# Patient Record
Sex: Male | Born: 1950 | Race: Black or African American | Hispanic: No | Marital: Married | Smoking: Former smoker
Health system: Southern US, Community
[De-identification: ages and names within clinical notes are randomized; demographics above are authoritative.]

## PROBLEM LIST (undated history)

## (undated) DIAGNOSIS — N451 Epididymitis: Secondary | ICD-10-CM

## (undated) DIAGNOSIS — E785 Hyperlipidemia, unspecified: Secondary | ICD-10-CM

## (undated) DIAGNOSIS — I739 Peripheral vascular disease, unspecified: Secondary | ICD-10-CM

## (undated) DIAGNOSIS — I2510 Atherosclerotic heart disease of native coronary artery without angina pectoris: Secondary | ICD-10-CM

## (undated) DIAGNOSIS — I10 Essential (primary) hypertension: Secondary | ICD-10-CM

## (undated) DIAGNOSIS — E119 Type 2 diabetes mellitus without complications: Secondary | ICD-10-CM

## (undated) HISTORY — PX: HEMORRHOID SURGERY: SHX153

## (undated) HISTORY — PX: ABOVE KNEE LEG AMPUTATION: SUR20

---

## 2015-08-03 ENCOUNTER — Encounter (HOSPITAL_COMMUNITY): Payer: Self-pay | Admitting: *Deleted

## 2015-08-03 ENCOUNTER — Emergency Department (HOSPITAL_COMMUNITY)
Admission: EM | Admit: 2015-08-03 | Discharge: 2015-08-03 | Disposition: A | Discharge status: Home / Self Care | Payer: Medicare Other | Attending: Emergency Medicine | Admitting: Emergency Medicine

## 2015-08-03 DIAGNOSIS — Z79899 Other long term (current) drug therapy: Secondary | ICD-10-CM | POA: Diagnosis not present

## 2015-08-03 DIAGNOSIS — E1165 Type 2 diabetes mellitus with hyperglycemia: Secondary | ICD-10-CM | POA: Diagnosis not present

## 2015-08-03 DIAGNOSIS — Z7984 Long term (current) use of oral hypoglycemic drugs: Secondary | ICD-10-CM | POA: Diagnosis not present

## 2015-08-03 DIAGNOSIS — Z7982 Long term (current) use of aspirin: Secondary | ICD-10-CM | POA: Diagnosis not present

## 2015-08-03 DIAGNOSIS — I2510 Atherosclerotic heart disease of native coronary artery without angina pectoris: Secondary | ICD-10-CM | POA: Insufficient documentation

## 2015-08-03 DIAGNOSIS — Z87891 Personal history of nicotine dependence: Secondary | ICD-10-CM | POA: Diagnosis not present

## 2015-08-03 DIAGNOSIS — R319 Hematuria, unspecified: Secondary | ICD-10-CM | POA: Diagnosis not present

## 2015-08-03 DIAGNOSIS — I10 Essential (primary) hypertension: Secondary | ICD-10-CM | POA: Diagnosis not present

## 2015-08-03 DIAGNOSIS — Z7901 Long term (current) use of anticoagulants: Secondary | ICD-10-CM | POA: Insufficient documentation

## 2015-08-03 DIAGNOSIS — R739 Hyperglycemia, unspecified: Secondary | ICD-10-CM

## 2015-08-03 HISTORY — DX: Peripheral vascular disease, unspecified: I73.9

## 2015-08-03 HISTORY — DX: Essential (primary) hypertension: I10

## 2015-08-03 HISTORY — DX: Atherosclerotic heart disease of native coronary artery without angina pectoris: I25.10

## 2015-08-03 HISTORY — DX: Type 2 diabetes mellitus without complications: E11.9

## 2015-08-03 LAB — URINALYSIS, ROUTINE W REFLEX MICROSCOPIC
Bilirubin Urine: NEGATIVE
Glucose, UA: 500 mg/dL — AB
Ketones, ur: NEGATIVE mg/dL
Leukocytes, UA: NEGATIVE
Nitrite: NEGATIVE
Specific Gravity, Urine: >1.03 — ABNORMAL HIGH (ref 1.005–1.030)
pH: 5 (ref 5.0–8.0)

## 2015-08-03 LAB — COMPREHENSIVE METABOLIC PANEL
ALK PHOS: 96 U/L (ref 38–126)
ALT: 18 U/L (ref 17–63)
ANION GAP: 11 (ref 5–15)
AST: 22 U/L (ref 15–41)
Albumin: 4.3 g/dL (ref 3.5–5.0)
BILIRUBIN TOTAL: 0.6 mg/dL (ref 0.3–1.2)
BUN: 16 mg/dL (ref 6–20)
CALCIUM: 9.5 mg/dL (ref 8.9–10.3)
CO2: 24 mmol/L (ref 22–32)
CREATININE: 1.38 mg/dL — AB (ref 0.61–1.24)
Chloride: 100 mmol/L — ABNORMAL LOW (ref 101–111)
GFR calc non Af Amer: 52 mL/min — ABNORMAL LOW (ref >60)
Glucose, Bld: 262 mg/dL — ABNORMAL HIGH (ref 65–99)
Potassium: 4 mmol/L (ref 3.5–5.1)
Sodium: 135 mmol/L (ref 135–145)
TOTAL PROTEIN: 7.4 g/dL (ref 6.5–8.1)

## 2015-08-03 LAB — URINE MICROSCOPIC-ADD ON

## 2015-08-03 LAB — CBC WITH DIFFERENTIAL/PLATELET
Basophils Absolute: 0 10*3/uL (ref 0.0–0.1)
Basophils Relative: 0 %
Eosinophils Absolute: 0.2 10*3/uL (ref 0.0–0.7)
Eosinophils Relative: 3 %
HCT: 42.4 % (ref 39.0–52.0)
Hemoglobin: 14.1 g/dL (ref 13.0–17.0)
Lymphocytes Relative: 39 %
Lymphs Abs: 2 10*3/uL (ref 0.7–4.0)
MCH: 27.5 pg (ref 26.0–34.0)
MCHC: 33.3 g/dL (ref 30.0–36.0)
MCV: 82.8 fL (ref 78.0–100.0)
Monocytes Absolute: 0.5 10*3/uL (ref 0.1–1.0)
Monocytes Relative: 9 %
Neutro Abs: 2.6 10*3/uL (ref 1.7–7.7)
Neutrophils Relative %: 49 %
Platelets: 278 10*3/uL (ref 150–400)
RBC: 5.12 MIL/uL (ref 4.22–5.81)
RDW: 13.5 % (ref 11.5–15.5)
WBC: 5.3 10*3/uL (ref 4.0–10.5)

## 2015-08-03 LAB — CBG MONITORING, ED
Glucose-Capillary: 265 mg/dL — ABNORMAL HIGH (ref 65–99)
Glucose-Capillary: 235 mg/dL — ABNORMAL HIGH (ref 65–99)

## 2015-08-03 MED ORDER — SODIUM CHLORIDE 0.9 % IV BOLUS (SEPSIS)
1000.0000 mL | Freq: Once | INTRAVENOUS | Status: AC
  Administered 2015-08-03: 1000 mL via INTRAVENOUS

## 2015-08-03 NOTE — Discharge Instructions (Signed)
Please continue your metformin and your glimepiride as prescribed. Your kidney function was mildly elevated which appears to be chronic and you also had a small amount of blood in your urine. I recommend you follow-up with your primary care physician for this.   Hyperglycemia Hyperglycemia occurs when the glucose (sugar) in your blood is too high. Hyperglycemia can happen for many reasons, but it most often happens to people who do not know they have diabetes or are not managing their diabetes properly.  CAUSES  Whether you have diabetes or not, there are other causes of hyperglycemia. Hyperglycemia can occur when you have diabetes, but it can also occur in other situations that you might not be as aware of, such as: Diabetes  If you have diabetes and are having problems controlling your blood glucose, hyperglycemia could occur because of some of the following reasons:  Not following your meal plan.  Not taking your diabetes medications or not taking it properly.  Exercising less or doing less activity than you normally do.  Being sick. Pre-diabetes  This cannot be ignored. Before people develop Type 2 diabetes, they almost always have "pre-diabetes." This is when your blood glucose levels are higher than normal, but not yet high enough to be diagnosed as diabetes. Research has shown that some long-term damage to the body, especially the heart and circulatory system, may already be occurring during pre-diabetes. If you take action to manage your blood glucose when you have pre-diabetes, you may delay or prevent Type 2 diabetes from developing. Stress  If you have diabetes, you may be "diet" controlled or on oral medications or insulin to control your diabetes. However, you may find that your blood glucose is higher than usual in the hospital whether you have diabetes or not. This is often referred to as "stress hyperglycemia." Stress can elevate your blood glucose. This happens because of  hormones put out by the body during times of stress. If stress has been the cause of your high blood glucose, it can be followed regularly by your caregiver. That way he/she can make sure your hyperglycemia does not continue to get worse or progress to diabetes. Steroids  Steroids are medications that act on the infection fighting system (immune system) to block inflammation or infection. One side effect can be a rise in blood glucose. Most people can produce enough extra insulin to allow for this rise, but for those who cannot, steroids make blood glucose levels go even higher. It is not unusual for steroid treatments to "uncover" diabetes that is developing. It is not always possible to determine if the hyperglycemia will go away after the steroids are stopped. A special blood test called an A1c is sometimes done to determine if your blood glucose was elevated before the steroids were started. SYMPTOMS  Thirsty.  Frequent urination.  Dry mouth.  Blurred vision.  Tired or fatigue.  Weakness.  Sleepy.  Tingling in feet or leg. DIAGNOSIS  Diagnosis is made by monitoring blood glucose in one or all of the following ways:  A1c test. This is a chemical found in your blood.  Fingerstick blood glucose monitoring.  Laboratory results. TREATMENT  First, knowing the cause of the hyperglycemia is important before the hyperglycemia can be treated. Treatment may include, but is not be limited to:  Education.  Change or adjustment in medications.  Change or adjustment in meal plan.  Treatment for an illness, infection, etc.  More frequent blood glucose monitoring.  Change in exercise plan.  Decreasing or stopping steroids.  Lifestyle changes. HOME CARE INSTRUCTIONS   Test your blood glucose as directed.  Exercise regularly. Your caregiver will give you instructions about exercise. Pre-diabetes or diabetes which comes on with stress is helped by exercising.  Eat wholesome,  balanced meals. Eat often and at regular, fixed times. Your caregiver or nutritionist will give you a meal plan to guide your sugar intake.  Being at an ideal weight is important. If needed, losing as little as 10 to 15 pounds may help improve blood glucose levels. SEEK MEDICAL CARE IF:   You have questions about medicine, activity, or diet.  You continue to have symptoms (problems such as increased thirst, urination, or weight gain). SEEK IMMEDIATE MEDICAL CARE IF:   You are vomiting or have diarrhea.  Your breath smells fruity.  You are breathing faster or slower.  You are very sleepy or incoherent.  You have numbness, tingling, or pain in your feet or hands.  You have chest pain.  Your symptoms get worse even though you have been following your caregiver's orders.  If you have any other questions or concerns.   This information is not intended to replace advice given to you by your health care provider. Make sure you discuss any questions you have with your health care provider.   Document Released: 12/10/2000 Document Revised: 09/08/2011 Document Reviewed: 02/20/2015 Elsevier Interactive Patient Education 2016 [employer]  Blood Glucose Monitoring, Adult Monitoring your blood glucose (also know as blood sugar) helps you to manage your diabetes. It also helps you and your health care provider monitor your diabetes and determine how well your treatment plan is working. WHY SHOULD YOU MONITOR YOUR BLOOD GLUCOSE?  It can help you understand how food, exercise, and medicine affect your blood glucose.  It allows you to know what your blood glucose is at any given moment. You can quickly tell if you are having low blood glucose (hypoglycemia) or high blood glucose (hyperglycemia).  It can help you and your health care provider know how to adjust your medicines.  It can help you understand how to manage an illness or adjust medicine for exercise. WHEN SHOULD YOU TEST? Your  health care provider will help you decide how often you should check your blood glucose. This may depend on the type of diabetes you have, your diabetes control, or the types of medicines you are taking. Be sure to write down all of your blood glucose readings so that this information can be reviewed with your health care provider. See below for examples of testing times that your health care provider may suggest. Type 1 Diabetes  Test at least 2 times per day if your diabetes is well controlled, if you are using an insulin pump, or if you perform multiple daily injections.  If your diabetes is not well controlled or if you are sick, you may need to test more often.  It is a good idea to also test:  Before every insulin injection.  Before and after exercise.  Between meals and 2 hours after a meal.  Occasionally between 2:00 a.m. and 3:00 a.m. Type 2 Diabetes  If you are taking insulin, test at least 2 times per day. However, it is best to test before every insulin injection.  If you take medicines by mouth (orally), test 2 times a day.  If you are on a controlled diet, test once a day.  If your diabetes is not well controlled or if you are sick,  you may need to monitor more often. HOW TO MONITOR YOUR BLOOD GLUCOSE Supplies Needed  Blood glucose meter.  Test strips for your meter. Each meter has its own strips. You must use the strips that go with your own meter.  A pricking needle (lancet).  A device that holds the lancet (lancing device).  A journal or log book to write down your results. Procedure  Wash your hands with soap and water. Alcohol is not preferred.  Prick the side of your finger (not the tip) with the lancet.  Gently milk the finger until a small drop of blood appears.  Follow the instructions that come with your meter for inserting the test strip, applying blood to the strip, and using your blood glucose meter. Other Areas to Get Blood for Testing Some  meters allow you to use other areas of your body (other than your finger) to test your blood. These areas are called alternative sites. The most common alternative sites are:  The forearm.  The thigh.  The back area of the lower leg.  The palm of the hand. The blood flow in these areas is slower. Therefore, the blood glucose values you get may be delayed, and the numbers are different from what you would get from your fingers. Do not use alternative sites if you think you are having hypoglycemia. Your reading will not be accurate. Always use a finger if you are having hypoglycemia. Also, if you cannot feel your lows (hypoglycemia unawareness), always use your fingers for your blood glucose checks. ADDITIONAL TIPS FOR GLUCOSE MONITORING  Do not reuse lancets.  Always carry your supplies with you.  All blood glucose meters have a 24-hour "hotline" number to call if you have questions or need help.  Adjust (calibrate) your blood glucose meter with a control solution after finishing a few boxes of strips. BLOOD GLUCOSE RECORD KEEPING It is a good idea to keep a daily record or log of your blood glucose readings. Most glucose meters, if not all, keep your glucose records stored in the meter. Some meters come with the ability to download your records to your home computer. Keeping a record of your blood glucose readings is especially helpful if you are wanting to look for patterns. Make notes to go along with the blood glucose readings because you might forget what happened at that exact time. Keeping good records helps you and your health care provider to work together to achieve good diabetes management.    This information is not intended to replace advice given to you by your health care provider. Make sure you discuss any questions you have with your health care provider.   Document Released: 06/19/2003 Document Revised: 07/07/2014 Document Reviewed: 11/08/2012 Elsevier Interactive Patient  Education [employer]

## 2015-08-03 NOTE — ED Notes (Signed)
Pt c/o his blood sugar readings being "up and down" for a couple of days.

## 2015-08-03 NOTE — ED Provider Notes (Signed)
CHIEF COMPLAINT: Hyperglycemia  HPI: Pt is a 65 y.o. male with history of hypertension, diabetes, peripheral vascular disease status post right AKA who is on Xarelto who presents to the emergency department complaints of hyperglycemia. Reports that his blood glucose has been "up and down" for the past 2 days. States the highest it was at home was 401. Was on metformin 1000 mg twice a day. Saw his primary care physician Dr. Merleen Milliner 2 days ago who started him on glimepiride 2 mg once a day. States he is all he had 1 dose of this medication yesterday morning. He denies feeling poorly. No fevers, cough, chest pain or shortness of breath, vomiting or diarrhea. No polydipsia or polyuria.  ROS: See HPI Constitutional: no fever  Eyes: no drainage  ENT: no runny nose   Cardiovascular:  no chest pain  Resp: no SOB  GI: no vomiting GU: no dysuria Integumentary: no rash  Allergy: no hives  Musculoskeletal: no leg swelling  Neurological: no slurred speech ROS otherwise negative  PAST MEDICAL HISTORY/PAST SURGICAL HISTORY:  Past Medical History  Diagnosis Date  . Diabetes mellitus without complication (HCC)   . Hypertension   . Coronary artery disease   . Peripheral arterial disease (HCC)     MEDICATIONS:  Prior to Admission medications   Medication Sig Start Date End Date Taking? Authorizing Provider  aspirin 81 MG tablet Take 81 mg by mouth daily.   Yes Historical Provider, MD  atorvastatin (LIPITOR) 40 MG tablet Take 40 mg by mouth daily.   Yes Historical Provider, MD  cloNIDine (CATAPRES) 0.3 MG tablet Take 0.3 mg by mouth 2 (two) times daily.   Yes Historical Provider, MD  ferrous sulfate 325 (65 FE) MG tablet Take 325 mg by mouth 2 (two) times daily with a meal.   Yes Historical Provider, MD  glimepiride (AMARYL) 2 MG tablet Take 2 mg by mouth daily with breakfast.   Yes Historical Provider, MD  hydrALAZINE (APRESOLINE) 25 MG tablet Take 25 mg by mouth 3 (three) times daily.   Yes  Historical Provider, MD  labetalol (NORMODYNE) 200 MG tablet Take 400 mg by mouth 2 (two) times daily.   Yes Historical Provider, MD  losartan (COZAAR) 50 MG tablet Take 50 mg by mouth daily.   Yes Historical Provider, MD  magnesium oxide (MAG-OX) 400 MG tablet Take 400 mg by mouth 2 (two) times daily.   Yes Historical Provider, MD  metFORMIN (GLUCOPHAGE) 1000 MG tablet Take 1,000 mg by mouth 2 (two) times daily with a meal.   Yes Historical Provider, MD  montelukast (SINGULAIR) 10 MG tablet Take 10 mg by mouth at bedtime.   Yes Historical Provider, MD  rivaroxaban (XARELTO) 20 MG TABS tablet Take 20 mg by mouth daily with supper.   Yes Historical Provider, MD    ALLERGIES:  Allergies  Allergen Reactions  . Ivp Dye [Iodinated Diagnostic Agents]     SOCIAL HISTORY:  Social History  Substance Use Topics  . Smoking status: Former Games developer  . Smokeless tobacco: Not on file  . Alcohol Use: No    FAMILY HISTORY: History reviewed. No pertinent family history.  EXAM: BP 131/49 mmHg  Pulse 66  Temp(Src) 97.8 F (36.6 C) (Oral)  Resp 20  Ht  (1.676 m)  Wt 205 lb (92.987 kg)  BMI 33.10 kg/m2  SpO2 98% CONSTITUTIONAL: Alert and oriented and responds appropriately to questions. Well-appearing; well-nourished, pleasant, smiling, laughing HEAD: Normocephalic EYES: Conjunctivae clear, PERRL ENT: normal nose;  no rhinorrhea; moist mucous membranes; pharynx without lesions noted NECK: Supple, no meningismus, no LAD  CARD: RRR; S1 and S2 appreciated; no murmurs, no clicks, no rubs, no gallops RESP: Normal chest excursion without splinting or tachypnea; breath sounds clear and equal bilaterally; no wheezes, no rhonchi, no rales, no hypoxia or respiratory distress, speaking full sentences ABD/GI: Normal bowel sounds; non-distended; soft, non-tender, no rebound, no guarding, no peritoneal signs BACK:  The back appears normal and is non-tender to palpation, there is no CVA tenderness EXT:  Status post right AKA, Normal ROM in all joints; non-tender to palpation; no edema; normal capillary refill; no cyanosis, no calf tenderness or swelling    SKIN: Normal color for age and race; warm NEURO: Moves all extremities equally, sensation to light touch intact diffusely, cranial nerves II through XII intact PSYCH: The patient's mood and manner are appropriate. Grooming and personal hygiene are appropriate.  MEDICAL DECISION MAKING: Patient here with complaints of hyperglycemia. Blood glucose is 265 today. Exam is benign and he has no complaints. He is hemodynamically stable. Discussed with patient that it may take time for his, pruritus start working. Had a lengthy discussion with patient and his family about diet. He states that he does eat sweets and drinks sodas. Discussed with him that this is likely causing some of his hyperglycemia. I recommended that he check his blood sugar in the morning before he has eaten and then 2 hours after each meal. Discussed with him that have his blood glucoses over 500 and he cannot get it down that he return to the emergency department. We'll check basic labs and urine today to enter there is no other cause that could be contributing to his hyperglycemia. We'll give IV fluids and recheck blood glucose.  ED PROGRESS: After liter fluid patient's blood glucoses down to 235. He still has no complaints. Labs show creatinine of 1.28 which appears to be chronic as his BUN is normal. No old for comparison. Recommended that he avoid NSAIDs and drink plain water and follow-up with his primary care physician for this. He also has some hematuria but no sign of urinary tract infection. Culture is pending. Discussed this finding with patient as well as advised him to follow-up with his primary care physician. I feel he is safe to be discharged home and continue his metformin 1000 mg twice a day and, prior to milligrams once a day. I have given him and his family at a large  amount of information on diet control and diabetes. They verbalize understanding and are comfortable with this plan. He does have a PCP for follow-up.     Layla Maw Ward, DO 08/03/15 [phone]

## 2015-08-06 LAB — URINE CULTURE: Culture: >=100000

## 2015-08-07 ENCOUNTER — Telehealth (HOSPITAL_COMMUNITY): Payer: Self-pay

## 2015-08-07 NOTE — Progress Notes (Signed)
ED Antimicrobial Stewardship Positive Culture Follow Up   Randeep Biondolillo is an 65 y.o. male who presented to [facility] on 08/03/2015 with a chief complaint of  Chief Complaint  Patient presents with  . Hyperglycemia    Recent Results (from the past 720 hour(s))  Urine culture     Status: None   Collection Time: 08/03/15  5:04 AM  Result Value Ref Range Status   Specimen Description URINE, CLEAN CATCH  Final   Special Requests NONE  Final   Culture   Final    >=100,000 COLONIES/mL AEROCOCCUS URINAE Performed at [facility]    Report Status 08/06/2015 FINAL  Final     Patient discharged originally without antimicrobial agent and treatment is now indicated   New antibiotic prescription: Macrobid  PO BID x 5 days  ED Provider: Allen Derry PA-C   Armandina Stammer 08/07/2015, 9:06 AM Infectious Diseases Pharmacist Phone# [phone]

## 2015-08-07 NOTE — Telephone Encounter (Signed)
Post ED Visit - Positive Culture Follow-up: Chart Hand-off to ED Flow Manager  Culture assessed and recommendations reviewed by:  Isaac Bliss, Pharm.D., BCPS  Celedonio Miyamoto, Pharm.D., BCPS-AQ ID  Georgina Pillion, Pharm.D., BCPS  [place], [address].D., BCPS, AAHIVP  Estella Husk, Pharm .D., BCPS, AAHIVP  Tennis Must, Pharm.D.  Casilda Carls, Pharm.D.  Positive urine culture   Patient discharged without antimicrobial prescription and treatment is now indicated  Organism is resistant to prescribed ED discharge antimicrobial  Patient with positive blood cultures  Changes discussed with ED provider: [place] Ravens Camprobi-soms New antibiotic prescription Macrobid  po bid x 5 days.   No answer. Letter sent to address on file  Ashley Jacobs 08/07/2015, 11:35 AM

## 2015-08-18 ENCOUNTER — Telehealth (HOSPITAL_COMMUNITY): Payer: Self-pay

## 2015-08-18 NOTE — Telephone Encounter (Signed)
Unable to contact pt by mail or telephone. Unable to communicate lab results or treatment changes. 

## 2015-08-23 ENCOUNTER — Encounter (HOSPITAL_COMMUNITY): Payer: Self-pay | Admitting: Emergency Medicine

## 2015-08-23 ENCOUNTER — Emergency Department (HOSPITAL_COMMUNITY): Payer: Medicare Other

## 2015-08-23 ENCOUNTER — Emergency Department (HOSPITAL_COMMUNITY)
Admission: EM | Admit: 2015-08-23 | Discharge: 2015-08-23 | Disposition: A | Discharge status: Home / Self Care | Payer: Medicare Other | Source: Home / Self Care | Attending: Emergency Medicine | Admitting: Emergency Medicine

## 2015-08-23 DIAGNOSIS — E119 Type 2 diabetes mellitus without complications: Secondary | ICD-10-CM | POA: Insufficient documentation

## 2015-08-23 DIAGNOSIS — I739 Peripheral vascular disease, unspecified: Secondary | ICD-10-CM | POA: Insufficient documentation

## 2015-08-23 DIAGNOSIS — N3090 Cystitis, unspecified without hematuria: Secondary | ICD-10-CM

## 2015-08-23 DIAGNOSIS — I10 Essential (primary) hypertension: Secondary | ICD-10-CM | POA: Insufficient documentation

## 2015-08-23 DIAGNOSIS — Z7984 Long term (current) use of oral hypoglycemic drugs: Secondary | ICD-10-CM | POA: Insufficient documentation

## 2015-08-23 DIAGNOSIS — E785 Hyperlipidemia, unspecified: Secondary | ICD-10-CM | POA: Insufficient documentation

## 2015-08-23 DIAGNOSIS — Z87891 Personal history of nicotine dependence: Secondary | ICD-10-CM | POA: Insufficient documentation

## 2015-08-23 DIAGNOSIS — Z79899 Other long term (current) drug therapy: Secondary | ICD-10-CM | POA: Insufficient documentation

## 2015-08-23 DIAGNOSIS — I2510 Atherosclerotic heart disease of native coronary artery without angina pectoris: Secondary | ICD-10-CM | POA: Insufficient documentation

## 2015-08-23 DIAGNOSIS — A419 Sepsis, unspecified organism: Secondary | ICD-10-CM | POA: Diagnosis not present

## 2015-08-23 DIAGNOSIS — R509 Fever, unspecified: Secondary | ICD-10-CM | POA: Diagnosis not present

## 2015-08-23 HISTORY — DX: Hyperlipidemia, unspecified: E78.5

## 2015-08-23 LAB — URINE MICROSCOPIC-ADD ON: SQUAMOUS EPITHELIAL / LPF: NONE SEEN

## 2015-08-23 LAB — CBC WITH DIFFERENTIAL/PLATELET
Basophils Absolute: 0 10*3/uL (ref 0.0–0.1)
Basophils Relative: 0 %
EOS ABS: 0.3 10*3/uL (ref 0.0–0.7)
EOS PCT: 2 %
HCT: 42.2 % (ref 39.0–52.0)
Hemoglobin: 13.6 g/dL (ref 13.0–17.0)
LYMPHS ABS: 1.8 10*3/uL (ref 0.7–4.0)
Lymphocytes Relative: 12 %
MCH: 26.7 pg (ref 26.0–34.0)
MCHC: 32.2 g/dL (ref 30.0–36.0)
MCV: 82.7 fL (ref 78.0–100.0)
MONOS PCT: 4 %
Monocytes Absolute: 0.6 10*3/uL (ref 0.1–1.0)
NEUTROS PCT: 82 %
Neutro Abs: 11.6 10*3/uL — ABNORMAL HIGH (ref 1.7–7.7)
PLATELETS: 366 10*3/uL (ref 150–400)
RBC: 5.1 MIL/uL (ref 4.22–5.81)
RDW: 13.8 % (ref 11.5–15.5)
WBC: 14.2 10*3/uL — ABNORMAL HIGH (ref 4.0–10.5)

## 2015-08-23 LAB — URINALYSIS, ROUTINE W REFLEX MICROSCOPIC
BILIRUBIN URINE: NEGATIVE
BILIRUBIN URINE: NEGATIVE
Glucose, UA: NEGATIVE mg/dL
Glucose, UA: NEGATIVE mg/dL
KETONES UR: NEGATIVE mg/dL
Ketones, ur: NEGATIVE mg/dL
NITRITE: POSITIVE — AB
Nitrite: NEGATIVE
PH: 5 (ref 5.0–8.0)
Protein, ur: 30 mg/dL — AB
Protein, ur: 100 mg/dL — AB
pH: 5 (ref 5.0–8.0)

## 2015-08-23 LAB — COMPREHENSIVE METABOLIC PANEL
ALT: 15 U/L — AB (ref 17–63)
ANION GAP: 11 (ref 5–15)
AST: 17 U/L (ref 15–41)
Albumin: 4 g/dL (ref 3.5–5.0)
Alkaline Phosphatase: 92 U/L (ref 38–126)
BUN: 16 mg/dL (ref 6–20)
CHLORIDE: 104 mmol/L (ref 101–111)
CO2: 24 mmol/L (ref 22–32)
CREATININE: 1.23 mg/dL (ref 0.61–1.24)
Calcium: 9.8 mg/dL (ref 8.9–10.3)
GFR, EST NON AFRICAN AMERICAN: 60 mL/min — AB (ref >60)
Glucose, Bld: 194 mg/dL — ABNORMAL HIGH (ref 65–99)
POTASSIUM: 4.6 mmol/L (ref 3.5–5.1)
Sodium: 139 mmol/L (ref 135–145)
Total Bilirubin: 0.3 mg/dL (ref 0.3–1.2)
Total Protein: 7.9 g/dL (ref 6.5–8.1)

## 2015-08-23 MED ORDER — TRAMADOL HCL 50 MG PO TABS: 50.0000 mg | ORAL_TABLET | Freq: Four times a day (QID) | ORAL | Status: DC | PRN

## 2015-08-23 MED ORDER — CEPHALEXIN 500 MG PO CAPS: 500.0000 mg | ORAL_CAPSULE | Freq: Four times a day (QID) | ORAL | Status: DC

## 2015-08-23 MED ORDER — CEPHALEXIN 500 MG PO CAPS
500.0000 mg | ORAL_CAPSULE | Freq: Once | ORAL | Status: AC
Start: 2015-08-23 — End: 2015-08-23
  Administered 2015-08-23: 500 mg via ORAL
  Filled 2015-08-23: qty 1

## 2015-08-23 MED ORDER — KETOROLAC TROMETHAMINE 30 MG/ML IJ SOLN
30.0000 mg | Freq: Once | INTRAMUSCULAR | Status: AC
  Administered 2015-08-23: 30 mg via INTRAVENOUS
  Filled 2015-08-23: qty 1

## 2015-08-23 NOTE — ED Provider Notes (Signed)
CSN: [id number]     Arrival date & time 08/23/15  4098 History   First MD Initiated Contact with Patient 08/23/15 [phone]     Chief Complaint  Patient presents with  . Flank Pain     (Consider location/radiation/quality/duration/timing/severity/associated sxs/prior Treatment) HPI Comments: The patient is a 65 year old male, he has a history of diabetes and hypertension as well as peripheral arterial disease, he is status post right above-the-knee amputation which she states was done in March of last year secondary to poor perfusion. He presents with less than 12 hours of symptoms including blood in his urine last night, he reports no burning with urination to me. He also reports that he has had left lower quadrant pain and possibly some left flank pain. It gets worse when he moves, better when he holds still, sometimes the pain is there any way. No fevers chills nausea or diarrhea. The symptoms are persistent throughout the evening  Patient is a 65 y.o. male presenting with flank pain. The history is provided by the patient.  Flank Pain    Past Medical History  Diagnosis Date  . Diabetes mellitus without complication (HCC)   . Hypertension   . Coronary artery disease   . Peripheral arterial disease (HCC)   . Hyperlipidemia    Past Surgical History  Procedure Laterality Date  . Above knee leg amputation      right leg  . Hemorrhoid surgery     History reviewed. No pertinent family history. Social History  Substance Use Topics  . Smoking status: Former Games developer  . Smokeless tobacco: None  . Alcohol Use: No    Review of Systems  Genitourinary: Positive for flank pain.  All other systems reviewed and are negative.     Allergies  Ivp dye  Home Medications   Prior to Admission medications   Medication Sig Start Date End Date Taking? Authorizing Provider  aspirin 81 MG tablet Take 81 mg by mouth daily.   Yes Historical Provider, MD  atorvastatin (LIPITOR) 40 MG tablet Take  40 mg by mouth daily.   Yes Historical Provider, MD  cloNIDine (CATAPRES) 0.3 MG tablet Take 0.3 mg by mouth 2 (two) times daily.   Yes Historical Provider, MD  ergocalciferol (VITAMIN D2) 50000 units capsule Take 50,000 Units by mouth once a week.   Yes Historical Provider, MD  ferrous sulfate 325 (65 FE) MG tablet Take 325 mg by mouth 2 (two) times daily with a meal.   Yes Historical Provider, MD  glimepiride (AMARYL) 2 MG tablet Take 2 mg by mouth daily with breakfast.   Yes Historical Provider, MD  hydrALAZINE (APRESOLINE) 25 MG tablet Take 25 mg by mouth 3 (three) times daily.   Yes Historical Provider, MD  labetalol (NORMODYNE) 200 MG tablet Take 400 mg by mouth 2 (two) times daily.   Yes Historical Provider, MD  losartan (COZAAR) 50 MG tablet Take 50 mg by mouth daily.   Yes Historical Provider, MD  magnesium oxide (MAG-OX) 400 MG tablet Take 400 mg by mouth 2 (two) times daily.   Yes Historical Provider, MD  metFORMIN (GLUCOPHAGE) 1000 MG tablet Take 1,000 mg by mouth 2 (two) times daily with a meal.   Yes Historical Provider, MD  montelukast (SINGULAIR) 10 MG tablet Take 10 mg by mouth at bedtime.   Yes Historical Provider, MD  rivaroxaban (XARELTO) 20 MG TABS tablet Take 20 mg by mouth daily with supper.   Yes Historical Provider, MD  cephALEXin (KEFLEX) 500  MG capsule Take 1 capsule (500 mg total) by mouth 4 (four) times daily. 08/23/15   Eber Hong, MD  traMADol (ULTRAM) 50 MG tablet Take 1 tablet (50 mg total) by mouth every 6 (six) hours as needed. 08/23/15   Eber Hong, MD   BP 151/60 mmHg  Pulse 83  Temp(Src) 98.9 F (37.2 C) (Oral)  Resp 18  Ht 5\' 6"  (1.676 m)  Wt 200 lb (90.719 kg)  BMI 32.30 kg/m2  SpO2 98% Physical Exam  Constitutional: He appears well-developed and well-nourished. No distress.  HENT:  Head: Normocephalic and atraumatic.  Mouth/Throat: Oropharynx is clear and moist. No oropharyngeal exudate.  Eyes: Conjunctivae and EOM are normal. Pupils are equal,  round, and reactive to light. Right eye exhibits no discharge. Left eye exhibits no discharge. No scleral icterus.  Neck: Normal range of motion. Neck supple. No JVD present. No thyromegaly present.  Cardiovascular: Normal rate, regular rhythm, normal heart sounds and intact distal pulses.  Exam reveals no gallop and no friction rub.   No murmur heard. Pulmonary/Chest: Effort normal and breath sounds normal. No respiratory distress. He has no wheezes. He has no rales.  Abdominal: Soft. Bowel sounds are normal. He exhibits no distension and no mass. There is tenderness ( Suprapubic and left lower quadrant tenderness without guarding or masses).  Musculoskeletal: Normal range of motion. He exhibits no edema or tenderness.  Lymphadenopathy:    He has no cervical adenopathy.  Neurological: He is alert. Coordination normal.  Skin: Skin is warm and dry. No rash noted. No erythema.  Psychiatric: He has a normal mood and affect. His behavior is normal.  Nursing note and vitals reviewed.   ED Course  Procedures (including critical care time) Labs Review Labs Reviewed  URINALYSIS, ROUTINE W REFLEX MICROSCOPIC (NOT AT [facility]) - Abnormal; Notable for the following:    Color, Urine BROWN (*)    APPearance HAZY (*)    Specific Gravity, Urine >1.030 (*)    Hgb urine dipstick LARGE (*)    Protein, ur 30 (*)    Nitrite POSITIVE (*)    Leukocytes, UA MODERATE (*)    All other components within normal limits  CBC WITH DIFFERENTIAL/PLATELET - Abnormal; Notable for the following:    WBC 14.2 (*)    Neutro Abs 11.6 (*)    All other components within normal limits  COMPREHENSIVE METABOLIC PANEL - Abnormal; Notable for the following:    Glucose, Bld 194 (*)    ALT 15 (*)    GFR calc non Af Amer 60 (*)    All other components within normal limits  URINE MICROSCOPIC-ADD ON - Abnormal; Notable for the following:    Squamous Epithelial / LPF 0-5 (*)    Bacteria, UA MANY (*)    All other components within  normal limits  URINALYSIS, ROUTINE W REFLEX MICROSCOPIC (NOT AT [facility]) - Abnormal; Notable for the following:    Color, Urine BROWN (*)    APPearance TURBID (*)    Specific Gravity, Urine >1.030 (*)    Hgb urine dipstick LARGE (*)    Protein, ur 100 (*)    Leukocytes, UA MODERATE (*)    All other components within normal limits  URINE MICROSCOPIC-ADD ON - Abnormal; Notable for the following:    Bacteria, UA MANY (*)    All other components within normal limits    Imaging Review Ct Abdomen Pelvis Wo Contrast  08/23/2015  CLINICAL DATA:  Left lower quadrant abdominal pain. Hematuria last  night. EXAM: CT ABDOMEN AND PELVIS WITHOUT CONTRAST TECHNIQUE: Multidetector CT imaging of the abdomen and pelvis was performed following the standard protocol without IV contrast. COMPARISON:  None. FINDINGS: Lower chest and abdominal wall: Borderline cardiomegaly. RCA atherosclerotic calcification or stent. Hepatobiliary: No focal liver abnormality.No evidence of biliary obstruction or stone. Pancreas: Unremarkable. Spleen: Unremarkable. Adrenals/Urinary Tract: Negative adrenals. No hydronephrosis or stone. Lobulated cortex of the lower pole right kidney, likely scarring. Unremarkable bladder given degree of distention. Reproductive:Mild symmetric enlargement of the prostate. Stomach/Bowel:  No obstruction. No appendicitis. Vascular/Lymphatic: Bilateral lower extremity bypass, with 3 grafts into the right thigh. No mass or adenopathy. Peritoneal: No ascites or pneumoperitoneum. Musculoskeletal: No acute abnormalities. Facet arthropathy in the lumbar spine. IMPRESSION: No explanation for acute abdominal pain. Electronically Signed   By: Marnee Spring M.D.   On: 08/23/2015 08:51   I have personally reviewed and evaluated these images and lab results as part of my medical decision-making.   MDM   Final diagnoses:  Cystitis    The patient has a well-healed stump of his right above-the-knee amputation, he has  minimal scant edema on the left lower extremity, he has tenderness in the abdomen which is concerning for potential urinary infection, kidney stone or diverticulitis, due to the patient's history of diabetes and peripheral arterial disease I will obtain labs including a urinalysis, blood counts, renal function and a CT scan to further evaluate the source of this pain. The patient is in agreement with the plan.  Labs show increased WBC UA c/w infection - culture ordered VS as below -s table Pt improved and informed of resutls Expressed understanding to d/c instructions  Meds given in ED:  Medications  cephALEXin (KEFLEX) capsule 500 mg (not administered)  ketorolac (TORADOL) 30 MG/ML injection 30 mg (30 mg Intravenous Given 08/23/15 0804)    New Prescriptions   CEPHALEXIN (KEFLEX) 500 MG CAPSULE    Take 1 capsule (500 mg total) by mouth 4 (four) times daily.   TRAMADOL (ULTRAM) 50 MG TABLET    Take 1 tablet (50 mg total) by mouth every 6 (six) hours as needed.         Eber Hong, MD 08/23/15 [phone]

## 2015-08-23 NOTE — ED Notes (Signed)
Pt reports blood in urine, burning with urination, foul odor since last night.  Pt also has left flank pain, denies fever/chills.

## 2015-08-23 NOTE — Discharge Instructions (Signed)

## 2015-08-24 ENCOUNTER — Encounter (HOSPITAL_COMMUNITY): Payer: Self-pay | Admitting: Emergency Medicine

## 2015-08-24 ENCOUNTER — Emergency Department (HOSPITAL_COMMUNITY): Payer: Medicare Other

## 2015-08-24 ENCOUNTER — Inpatient Hospital Stay (HOSPITAL_COMMUNITY)
Admission: EM | Admit: 2015-08-24 | Discharge: 2015-08-26 | DRG: 872 | Disposition: A | Discharge status: Home / Self Care | Payer: Medicare Other | Attending: Internal Medicine | Admitting: Internal Medicine

## 2015-08-24 DIAGNOSIS — I10 Essential (primary) hypertension: Secondary | ICD-10-CM | POA: Diagnosis not present

## 2015-08-24 DIAGNOSIS — Z23 Encounter for immunization: Secondary | ICD-10-CM

## 2015-08-24 DIAGNOSIS — I2510 Atherosclerotic heart disease of native coronary artery without angina pectoris: Secondary | ICD-10-CM | POA: Diagnosis present

## 2015-08-24 DIAGNOSIS — N390 Urinary tract infection, site not specified: Secondary | ICD-10-CM

## 2015-08-24 DIAGNOSIS — A419 Sepsis, unspecified organism: Secondary | ICD-10-CM | POA: Diagnosis present

## 2015-08-24 DIAGNOSIS — R319 Hematuria, unspecified: Secondary | ICD-10-CM

## 2015-08-24 DIAGNOSIS — Z7982 Long term (current) use of aspirin: Secondary | ICD-10-CM | POA: Diagnosis not present

## 2015-08-24 DIAGNOSIS — Z7984 Long term (current) use of oral hypoglycemic drugs: Secondary | ICD-10-CM

## 2015-08-24 DIAGNOSIS — E1122 Type 2 diabetes mellitus with diabetic chronic kidney disease: Secondary | ICD-10-CM | POA: Diagnosis present

## 2015-08-24 DIAGNOSIS — I129 Hypertensive chronic kidney disease with stage 1 through stage 4 chronic kidney disease, or unspecified chronic kidney disease: Secondary | ICD-10-CM | POA: Diagnosis present

## 2015-08-24 DIAGNOSIS — N183 Chronic kidney disease, stage 3 (moderate): Secondary | ICD-10-CM

## 2015-08-24 DIAGNOSIS — R509 Fever, unspecified: Secondary | ICD-10-CM | POA: Diagnosis present

## 2015-08-24 DIAGNOSIS — N50819 Testicular pain, unspecified: Secondary | ICD-10-CM

## 2015-08-24 DIAGNOSIS — E785 Hyperlipidemia, unspecified: Secondary | ICD-10-CM | POA: Diagnosis present

## 2015-08-24 DIAGNOSIS — N453 Epididymo-orchitis: Secondary | ICD-10-CM | POA: Diagnosis present

## 2015-08-24 DIAGNOSIS — E1100 Type 2 diabetes mellitus with hyperosmolarity without nonketotic hyperglycemic-hyperosmolar coma (NKHHC): Secondary | ICD-10-CM

## 2015-08-24 DIAGNOSIS — A4102 Sepsis due to Methicillin resistant Staphylococcus aureus: Secondary | ICD-10-CM | POA: Diagnosis not present

## 2015-08-24 DIAGNOSIS — N50812 Left testicular pain: Secondary | ICD-10-CM | POA: Diagnosis present

## 2015-08-24 DIAGNOSIS — Z7901 Long term (current) use of anticoagulants: Secondary | ICD-10-CM

## 2015-08-24 DIAGNOSIS — I739 Peripheral vascular disease, unspecified: Secondary | ICD-10-CM | POA: Diagnosis present

## 2015-08-24 DIAGNOSIS — Z87891 Personal history of nicotine dependence: Secondary | ICD-10-CM

## 2015-08-24 DIAGNOSIS — Z79899 Other long term (current) drug therapy: Secondary | ICD-10-CM

## 2015-08-24 DIAGNOSIS — A4901 Methicillin susceptible Staphylococcus aureus infection, unspecified site: Secondary | ICD-10-CM | POA: Diagnosis present

## 2015-08-24 DIAGNOSIS — E119 Type 2 diabetes mellitus without complications: Secondary | ICD-10-CM

## 2015-08-24 DIAGNOSIS — A4101 Sepsis due to Methicillin susceptible Staphylococcus aureus: Secondary | ICD-10-CM | POA: Diagnosis not present

## 2015-08-24 LAB — URINALYSIS, ROUTINE W REFLEX MICROSCOPIC
BILIRUBIN URINE: NEGATIVE
Glucose, UA: NEGATIVE mg/dL
Ketones, ur: NEGATIVE mg/dL
Nitrite: NEGATIVE
PH: 5 (ref 5.0–8.0)
Protein, ur: 30 mg/dL — AB

## 2015-08-24 LAB — CBC WITH DIFFERENTIAL/PLATELET
BASOS PCT: 0 %
Basophils Absolute: 0 10*3/uL (ref 0.0–0.1)
EOS ABS: 0.1 10*3/uL (ref 0.0–0.7)
EOS PCT: 0 %
HCT: 40.1 % (ref 39.0–52.0)
Hemoglobin: 13.1 g/dL (ref 13.0–17.0)
LYMPHS ABS: 2.5 10*3/uL (ref 0.7–4.0)
Lymphocytes Relative: 10 %
MCH: 27.2 pg (ref 26.0–34.0)
MCHC: 32.7 g/dL (ref 30.0–36.0)
MCV: 83.2 fL (ref 78.0–100.0)
MONO ABS: 1.4 10*3/uL — AB (ref 0.1–1.0)
MONOS PCT: 5 %
Neutro Abs: 22.3 10*3/uL — ABNORMAL HIGH (ref 1.7–7.7)
Neutrophils Relative %: 85 %
Platelets: 335 10*3/uL (ref 150–400)
RBC: 4.82 MIL/uL (ref 4.22–5.81)
RDW: 14.2 % (ref 11.5–15.5)
WBC: 26.3 10*3/uL — ABNORMAL HIGH (ref 4.0–10.5)

## 2015-08-24 LAB — BASIC METABOLIC PANEL
Anion gap: 13 (ref 5–15)
BUN: 14 mg/dL (ref 6–20)
CALCIUM: 9.2 mg/dL (ref 8.9–10.3)
CHLORIDE: 102 mmol/L (ref 101–111)
CO2: 22 mmol/L (ref 22–32)
CREATININE: 1.37 mg/dL — AB (ref 0.61–1.24)
GFR calc non Af Amer: 53 mL/min — ABNORMAL LOW (ref >60)
GLUCOSE: 169 mg/dL — AB (ref 65–99)
Potassium: 4 mmol/L (ref 3.5–5.1)
Sodium: 137 mmol/L (ref 135–145)

## 2015-08-24 LAB — URINE MICROSCOPIC-ADD ON

## 2015-08-24 LAB — LACTIC ACID, PLASMA: LACTIC ACID, VENOUS: 1.2 mmol/L (ref 0.5–2.0)

## 2015-08-24 MED ORDER — VANCOMYCIN HCL IN DEXTROSE 1-5 GM/200ML-% IV SOLN: 1000.0000 mg | Freq: Once | INTRAVENOUS | Status: DC

## 2015-08-24 MED ORDER — INSULIN ASPART 100 UNIT/ML ~~LOC~~ SOLN
0.0000 [IU] | Freq: Three times a day (TID) | SUBCUTANEOUS | Status: DC
Start: 2015-08-25 — End: 2015-08-26
  Administered 2015-08-25 (×3): 3 [IU] via SUBCUTANEOUS
  Administered 2015-08-26: 2 [IU] via SUBCUTANEOUS
  Administered 2015-08-26: 5 [IU] via SUBCUTANEOUS

## 2015-08-24 MED ORDER — ATORVASTATIN CALCIUM 40 MG PO TABS
40.0000 mg | ORAL_TABLET | Freq: Every evening | ORAL | Status: DC
  Administered 2015-08-25: 40 mg via ORAL
  Filled 2015-08-24: qty 1

## 2015-08-24 MED ORDER — HYDRALAZINE HCL 25 MG PO TABS
25.0000 mg | ORAL_TABLET | Freq: Three times a day (TID) | ORAL | Status: DC
  Administered 2015-08-25 – 2015-08-26 (×4): 25 mg via ORAL
  Filled 2015-08-24 (×4): qty 1

## 2015-08-24 MED ORDER — VANCOMYCIN HCL 10 G IV SOLR
1250.0000 mg | Freq: Once | INTRAVENOUS | Status: AC
  Administered 2015-08-24: 1250 mg via INTRAVENOUS
  Filled 2015-08-24: qty 1250

## 2015-08-24 MED ORDER — POLYETHYLENE GLYCOL 3350 17 G PO PACK: 17.0000 g | PACK | Freq: Every day | ORAL | Status: DC | PRN

## 2015-08-24 MED ORDER — BISACODYL 5 MG PO TBEC
5.0000 mg | DELAYED_RELEASE_TABLET | Freq: Every day | ORAL | Status: DC | PRN
  Administered 2015-08-26: 5 mg via ORAL
  Filled 2015-08-24: qty 1

## 2015-08-24 MED ORDER — MONTELUKAST SODIUM 10 MG PO TABS
10.0000 mg | ORAL_TABLET | Freq: Every day | ORAL | Status: DC
  Administered 2015-08-25: 10 mg via ORAL
  Filled 2015-08-24: qty 1

## 2015-08-24 MED ORDER — SODIUM CHLORIDE 0.9 % IV SOLN
INTRAVENOUS | Status: DC
  Administered 2015-08-24: 100 mL/h via INTRAVENOUS

## 2015-08-24 MED ORDER — ACETAMINOPHEN 325 MG PO TABS: 650.0000 mg | ORAL_TABLET | Freq: Four times a day (QID) | ORAL | Status: DC | PRN

## 2015-08-24 MED ORDER — ONDANSETRON HCL 4 MG PO TABS: 4.0000 mg | ORAL_TABLET | Freq: Four times a day (QID) | ORAL | Status: DC | PRN

## 2015-08-24 MED ORDER — DEXTROSE 5 % IV SOLN: 2.0000 g | Freq: Once | INTRAVENOUS | Status: DC

## 2015-08-24 MED ORDER — ASPIRIN EC 81 MG PO TBEC
81.0000 mg | DELAYED_RELEASE_TABLET | Freq: Every morning | ORAL | Status: DC
  Administered 2015-08-25 – 2015-08-26 (×2): 81 mg via ORAL
  Filled 2015-08-24 (×2): qty 1

## 2015-08-24 MED ORDER — HYDROMORPHONE HCL 1 MG/ML IJ SOLN
0.5000 mg | INTRAMUSCULAR | Status: DC | PRN
  Administered 2015-08-25: 0.5 mg via INTRAVENOUS
  Filled 2015-08-24: qty 1

## 2015-08-24 MED ORDER — ACETAMINOPHEN 650 MG RE SUPP: 650.0000 mg | Freq: Four times a day (QID) | RECTAL | Status: DC | PRN

## 2015-08-24 MED ORDER — FERROUS SULFATE 325 (65 FE) MG PO TABS
325.0000 mg | ORAL_TABLET | Freq: Two times a day (BID) | ORAL | Status: DC
  Administered 2015-08-25 – 2015-08-26 (×3): 325 mg via ORAL
  Filled 2015-08-24 (×3): qty 1

## 2015-08-24 MED ORDER — LABETALOL HCL 200 MG PO TABS
400.0000 mg | ORAL_TABLET | Freq: Two times a day (BID) | ORAL | Status: DC
  Administered 2015-08-25 – 2015-08-26 (×3): 400 mg via ORAL
  Filled 2015-08-24 (×3): qty 2

## 2015-08-24 MED ORDER — ONDANSETRON HCL 4 MG/2ML IJ SOLN: 4.0000 mg | Freq: Four times a day (QID) | INTRAMUSCULAR | Status: DC | PRN

## 2015-08-24 MED ORDER — LOSARTAN POTASSIUM 50 MG PO TABS
50.0000 mg | ORAL_TABLET | Freq: Every morning | ORAL | Status: DC
  Administered 2015-08-25 – 2015-08-26 (×2): 50 mg via ORAL
  Filled 2015-08-24 (×2): qty 1

## 2015-08-24 MED ORDER — CIPROFLOXACIN IN D5W 400 MG/200ML IV SOLN
400.0000 mg | Freq: Two times a day (BID) | INTRAVENOUS | Status: DC
  Administered 2015-08-24: 400 mg via INTRAVENOUS
  Filled 2015-08-24: qty 200

## 2015-08-24 MED ORDER — HYDROCODONE-ACETAMINOPHEN 5-325 MG PO TABS
1.0000 | ORAL_TABLET | ORAL | Status: DC | PRN
  Administered 2015-08-25 (×2): 2 via ORAL
  Administered 2015-08-25: 1 via ORAL
  Administered 2015-08-25 – 2015-08-26 (×3): 2 via ORAL
  Filled 2015-08-24 (×6): qty 2

## 2015-08-24 MED ORDER — SODIUM CHLORIDE 0.9 % IV SOLN
INTRAVENOUS | Status: AC
  Administered 2015-08-25: 06:00:00 via INTRAVENOUS

## 2015-08-24 MED ORDER — RIVAROXABAN 20 MG PO TABS
20.0000 mg | ORAL_TABLET | Freq: Every day | ORAL | Status: DC
  Administered 2015-08-25: 20 mg via ORAL
  Filled 2015-08-24 (×4): qty 1

## 2015-08-24 MED ORDER — ACETAMINOPHEN 325 MG PO TABS
650.0000 mg | ORAL_TABLET | Freq: Once | ORAL | Status: AC
Start: 2015-08-24 — End: 2015-08-24
  Administered 2015-08-24: 650 mg via ORAL
  Filled 2015-08-24: qty 2

## 2015-08-24 MED ORDER — MAGNESIUM OXIDE 400 (241.3 MG) MG PO TABS
400.0000 mg | ORAL_TABLET | Freq: Two times a day (BID) | ORAL | Status: DC
  Administered 2015-08-25 – 2015-08-26 (×3): 400 mg via ORAL
  Filled 2015-08-24 (×3): qty 1

## 2015-08-24 MED ORDER — CLONIDINE HCL 0.2 MG PO TABS
0.3000 mg | ORAL_TABLET | Freq: Two times a day (BID) | ORAL | Status: DC
  Administered 2015-08-25 – 2015-08-26 (×3): 0.3 mg via ORAL
  Filled 2015-08-24 (×5): qty 1

## 2015-08-24 MED ORDER — MORPHINE SULFATE (PF) 2 MG/ML IV SOLN
2.0000 mg | INTRAVENOUS | Status: AC | PRN
  Administered 2015-08-24 (×2): 2 mg via INTRAVENOUS
  Filled 2015-08-24 (×2): qty 1

## 2015-08-24 MED ORDER — VITAMIN D (ERGOCALCIFEROL) 1.25 MG (50000 UNIT) PO CAPS: 50000.0000 [IU] | ORAL_CAPSULE | ORAL | Status: DC

## 2015-08-24 MED ORDER — CEFEPIME HCL 2 G IJ SOLR: 2.0000 g | Freq: Once | INTRAMUSCULAR | Status: DC

## 2015-08-24 NOTE — ED Notes (Signed)
Having pain to left groin, rates pain 10/10.  Seen in ED yesterday by Dr Hyacinth Meeker and sent home on tordal, with no relief.

## 2015-08-24 NOTE — ED Notes (Signed)
Calling xray for [place]

## 2015-08-24 NOTE — ED Provider Notes (Signed)
CSN: [id number]     Arrival date & time 08/24/15  1653 History   First MD Initiated Contact with Patient 08/24/15 2032     Chief Complaint  Patient presents with  . Groin Pain    left  . Failure To Thrive  . Fever  . Hematuria      HPI  Pt was seen at 2040. Per pt and his family, c/o gradual onset and worsening of persistent left groin "pain" for the past 2 to 3 days. Has been associated with hematuria. Pt was evaluated in the ED yesterday for his symptoms, dx UTI, rx keflex. Pt's family states today pt continues to c/o groin pain and hematuria, but has had home fevers/chills, poor PO intake, left testicular pain, and generalized weakness/fatigue. Denies CP/palpitations, no SOB/cough, no N/V/D, no back pain, no rash.    Past Medical History  Diagnosis Date  . Diabetes mellitus without complication (HCC)   . Hypertension   . Coronary artery disease   . Peripheral arterial disease (HCC)   . Hyperlipidemia    Past Surgical History  Procedure Laterality Date  . Above knee leg amputation      right leg  . Hemorrhoid surgery      Social History  Substance Use Topics  . Smoking status: Former Games developer  . Smokeless tobacco: None  . Alcohol Use: No    Review of Systems ROS: Statement: All systems negative except as marked or noted in the HPI; Constitutional: +fever and chills, generalized weakness. ; ; Eyes: Negative for eye pain, redness and discharge. ; ; ENMT: Negative for ear pain, hoarseness, nasal congestion, sinus pressure and sore throat. ; ; Cardiovascular: Negative for chest pain, palpitations, diaphoresis, dyspnea and peripheral edema. ; ; Respiratory: Negative for cough, wheezing and stridor. ; ; Gastrointestinal: +poor PO intake. Negative for nausea, vomiting, diarrhea, blood in stool, hematemesis, jaundice and rectal bleeding. . ; ; Genitourinary: Negative for dysuria, flank pain and +hematuria, groin pain. ; ; Genital:  No penile drainage or rash, +left testicular pain and  swelling, no scrotal rash. ;; Musculoskeletal: Negative for back pain and neck pain. Negative for swelling and trauma.; ; Skin: Negative for pruritus, rash, abrasions, blisters, bruising and skin lesion.; ; Neuro: Negative for headache, lightheadedness and neck stiffness. Negative for altered level of consciousness , altered mental status, extremity weakness, paresthesias, involuntary movement, seizure and syncope.     Allergies  Ivp dye  Home Medications   Prior to Admission medications   Medication Sig Start Date End Date Taking? Authorizing Provider  aspirin 81 MG tablet Take 81 mg by mouth daily.    Historical Provider, MD  atorvastatin (LIPITOR) 40 MG tablet Take 40 mg by mouth daily.    Historical Provider, MD  cephALEXin (KEFLEX) 500 MG capsule Take 1 capsule (500 mg total) by mouth 4 (four) times daily. 08/23/15   Eber Hong, MD  cloNIDine (CATAPRES) 0.3 MG tablet Take 0.3 mg by mouth 2 (two) times daily.    Historical Provider, MD  ergocalciferol (VITAMIN D2) 50000 units capsule Take 50,000 Units by mouth once a week.    Historical Provider, MD  ferrous sulfate 325 (65 FE) MG tablet Take 325 mg by mouth 2 (two) times daily with a meal.    Historical Provider, MD  glimepiride (AMARYL) 2 MG tablet Take 2 mg by mouth daily with breakfast.    Historical Provider, MD  hydrALAZINE (APRESOLINE) 25 MG tablet Take 25 mg by mouth 3 (three) times daily.  Historical Provider, MD  labetalol (NORMODYNE) 200 MG tablet Take 400 mg by mouth 2 (two) times daily.    Historical Provider, MD  losartan (COZAAR) 50 MG tablet Take 50 mg by mouth daily.    Historical Provider, MD  magnesium oxide (MAG-OX) 400 MG tablet Take 400 mg by mouth 2 (two) times daily.    Historical Provider, MD  metFORMIN (GLUCOPHAGE) 1000 MG tablet Take 1,000 mg by mouth 2 (two) times daily with a meal.    Historical Provider, MD  montelukast (SINGULAIR) 10 MG tablet Take 10 mg by mouth at bedtime.    Historical Provider, MD   rivaroxaban (XARELTO) 20 MG TABS tablet Take 20 mg by mouth daily with supper.    Historical Provider, MD  traMADol (ULTRAM) 50 MG tablet Take 1 tablet (50 mg total) by mouth every 6 (six) hours as needed. 08/23/15   Eber Hong, MD   BP 160/86 mmHg  Pulse 98  Temp(Src) 101.9 F (38.8 C) (Rectal)  Resp 18  Ht 5\' 6"  (1.676 m)  Wt 220 lb (99.791 kg)  BMI 35.53 kg/m2  SpO2 96% Physical Exam  2045: Physical examination:  Nursing notes reviewed; Vital signs and O2 SAT reviewed;  Constitutional: Well developed, Well nourished, Uncomfortable appearing.; Head:  Normocephalic, atraumatic; Eyes: EOMI, PERRL, No scleral icterus; ENMT: Mouth and pharynx normal, Mucous membranes dry; Neck: Supple, Full range of motion, No lymphadenopathy; Cardiovascular: Regular rate and rhythm, No gallop; Respiratory: Breath sounds clear & equal bilaterally, No wheezes.  Speaking full sentences with ease, Normal respiratory effort/excursion; Chest: Nontender, Movement normal; Abdomen: Soft, + mild suprapubic and LLQ tenderness to palp. No rebound or guarding. Nondistended, Normal bowel sounds; Genitourinary: No CVA tenderness. Genital and rectal exam performed with pt permission and male ED Tech chaperone present during exam.  No perineal erythema or soft tissue crepitus.  No penile lesions or drainage.  No scrotal erythema or lesions.  Normal testicular lie.  +left testicular edema and proximal posterior-lateral testicular tenderness to palp. No inguinal LAN or palpable masses.;; Extremities: Pulses normal, No tenderness, No edema, +right AKA.; Neuro: AA&Ox3, Major CN grossly intact. Speech clear. No gross focal motor or sensory deficits in extremities.; Skin: Color normal, Warm, Dry.   ED Course  Procedures (including critical care time) Labs Review  Imaging Review  I have personally reviewed and evaluated these images and lab results as part of my medical decision-making.   EKG Interpretation None      MDM   MDM Reviewed: previous chart, nursing note and vitals Reviewed previous: labs and CT scan Interpretation: labs and ultrasound     Ct Abdomen Pelvis Wo Contrast 08/23/2015  CLINICAL DATA:  Left lower quadrant abdominal pain. Hematuria last night. EXAM: CT ABDOMEN AND PELVIS WITHOUT CONTRAST TECHNIQUE: Multidetector CT imaging of the abdomen and pelvis was performed following the standard protocol without IV contrast. COMPARISON:  None. FINDINGS: Lower chest and abdominal wall: Borderline cardiomegaly. RCA atherosclerotic calcification or stent. Hepatobiliary: No focal liver abnormality.No evidence of biliary obstruction or stone. Pancreas: Unremarkable. Spleen: Unremarkable. Adrenals/Urinary Tract: Negative adrenals. No hydronephrosis or stone. Lobulated cortex of the lower pole right kidney, likely scarring. Unremarkable bladder given degree of distention. Reproductive:Mild symmetric enlargement of the prostate. Stomach/Bowel:  No obstruction. No appendicitis. Vascular/Lymphatic: Bilateral lower extremity bypass, with 3 grafts into the right thigh. No mass or adenopathy. Peritoneal: No ascites or pneumoperitoneum. Musculoskeletal: No acute abnormalities. Facet arthropathy in the lumbar spine. IMPRESSION: No explanation for acute abdominal pain. Electronically Signed  By: Marnee Spring M.D.   On: 08/23/2015 08:51    Results for orders placed or performed during the hospital encounter of 08/24/15  Culture, blood (routine x 2)  Result Value Ref Range   Specimen Description BLOOD LEFT ARM    Special Requests BOTTLES DRAWN AEROBIC AND ANAEROBIC [facility] EACH    Culture PENDING    Report Status PENDING   Culture, blood (routine x 2)  Result Value Ref Range   Specimen Description BLOOD LEFT HAND    Special Requests BOTTLES DRAWN AEROBIC AND ANAEROBIC 6CC EACH    Culture PENDING    Report Status PENDING   Basic metabolic panel  Result Value Ref Range   Sodium 137 135 - 145 mmol/L   Potassium 4.0  3.5 - 5.1 mmol/L   Chloride 102 101 - 111 mmol/L   CO2 22 22 - 32 mmol/L   Glucose, Bld 169 (H) 65 - 99 mg/dL   BUN 14 6 - 20 mg/dL   Creatinine, Ser 1.61 (H) 0.61 - 1.24 mg/dL   Calcium 9.2 8.9 - 09.6 mg/dL   GFR calc non Af Amer 53 (L) >60 mL/min   GFR calc Af Amer >60 >60 mL/min   Anion gap 13 5 - 15  CBC with Differential  Result Value Ref Range   WBC 26.3 (H) 4.0 - 10.5 K/uL   RBC 4.82 4.22 - 5.81 MIL/uL   Hemoglobin 13.1 13.0 - 17.0 g/dL   HCT 04.5 40.9 - 81.1 %   MCV 83.2 78.0 - 100.0 fL   MCH 27.2 26.0 - 34.0 pg   MCHC 32.7 30.0 - 36.0 g/dL   RDW 91.4 78.2 - 95.6 %   Platelets 335 150 - 400 K/uL   Neutrophils Relative % 85 %   Neutro Abs 22.3 (H) 1.7 - 7.7 K/uL   Lymphocytes Relative 10 %   Lymphs Abs 2.5 0.7 - 4.0 K/uL   Monocytes Relative 5 %   Monocytes Absolute 1.4 (H) 0.1 - 1.0 K/uL   Eosinophils Relative 0 %   Eosinophils Absolute 0.1 0.0 - 0.7 K/uL   Basophils Relative 0 %   Basophils Absolute 0.0 0.0 - 0.1 K/uL  Lactic acid, plasma  Result Value Ref Range   Lactic Acid, Venous 1.2 0.5 - 2.0 mmol/L  Urinalysis, Routine w reflex microscopic  Result Value Ref Range   Color, Urine YELLOW YELLOW   APPearance HAZY (A) CLEAR   Specific Gravity, Urine >1.030 (H) 1.005 - 1.030   pH 5.0 5.0 - 8.0   Glucose, UA NEGATIVE NEGATIVE mg/dL   Hgb urine dipstick LARGE (A) NEGATIVE   Bilirubin Urine NEGATIVE NEGATIVE   Ketones, ur NEGATIVE NEGATIVE mg/dL   Protein, ur 30 (A) NEGATIVE mg/dL   Nitrite NEGATIVE NEGATIVE   Leukocytes, UA SMALL (A) NEGATIVE  Urine microscopic-add on  Result Value Ref Range   Squamous Epithelial / LPF 0-5 (A) NONE SEEN   WBC, UA TOO NUMEROUS TO COUNT 0 - 5 WBC/hpf   RBC / HPF TOO NUMEROUS TO COUNT 0 - 5 RBC/hpf   Bacteria, UA MANY (A) NONE SEEN   US Scrotum 08/24/2015  CLINICAL DATA:  65 year old with 1 day history of left scrotal pain. Patient currently being treated for urinary tract infection. EXAM: SCROTAL ULTRASOUND DOPPLER  ULTRASOUND OF THE TESTICLES TECHNIQUE: Complete ultrasound examination of the testicles, epididymis, and other scrotal structures was performed. Color and spectral Doppler ultrasound were also utilized to evaluate blood flow to the testicles. COMPARISON:  None. FINDINGS: Right testicle Measurements: Approximately 4.2 x 2.6 x 2.4 cm. Normal parenchymal echotexture without mass or microlithiasis. Normal color Doppler flow without evidence of hyperemia. Left testicle Measurements: Approximately 3.5 x 3.1 x 2.9 cm. Normal parenchymal echotexture without mass or microlithiasis. Hyperemia on color Doppler evaluation. Right epididymis: Normal in size and appearance without evidence of hyperemia. Left epididymis:  Enlarged with hyperemia. Hydrocele:  None visualized. Varicocele:  None visualized. Other:  Marked thickening of the skin of the scrotum. Pulsed Doppler interrogation of both testes demonstrates normal low resistance arterial and venous waveforms bilaterally. IMPRESSION: Left epididymo-orchitis. Electronically Signed   By: Hulan Saas M.D.   On: 08/24/2015 21:57   [place] Art/ven Flow Abd Pelv Doppler 08/24/2015  CLINICAL DATA:  65 year old with 1 day history of left scrotal pain. Patient currently being treated for urinary tract infection. EXAM: SCROTAL ULTRASOUND DOPPLER ULTRASOUND OF THE TESTICLES TECHNIQUE: Complete ultrasound examination of the testicles, epididymis, and other scrotal structures was performed. Color and spectral Doppler ultrasound were also utilized to evaluate blood flow to the testicles. COMPARISON:  None. FINDINGS: Right testicle Measurements: Approximately 4.2 x 2.6 x 2.4 cm. Normal parenchymal echotexture without mass or microlithiasis. Normal color Doppler flow without evidence of hyperemia. Left testicle Measurements: Approximately 3.5 x 3.1 x 2.9 cm. Normal parenchymal echotexture without mass or microlithiasis. Hyperemia on color Doppler evaluation. Right epididymis: Normal in size  and appearance without evidence of hyperemia. Left epididymis:  Enlarged with hyperemia. Hydrocele:  None visualized. Varicocele:  None visualized. Other:  Marked thickening of the skin of the scrotum. Pulsed Doppler interrogation of both testes demonstrates normal low resistance arterial and venous waveforms bilaterally. IMPRESSION: Left epididymo-orchitis. Electronically Signed   By: Hulan Saas M.D.   On: 08/24/2015 21:57    2250:  Will dose IV cipro while UC and BC pending. APAP given for fever.  Dx and testing d/w pt and family.  Questions answered.  Verb understanding, agreeable to admit.  T/C to [facility] Dr. Antionette Char, case discussed, including:  HPI, pertinent PM/SHx, VS/PE, dx testing, ED course and treatment:  Agreeable to admit, requests to write temporary orders, obtain medical bed to team APAdmits.   Samuel Jester, DO 08/26/15 2304

## 2015-08-24 NOTE — Progress Notes (Signed)
ANTIBIOTIC CONSULT NOTE-Preliminary  Pharmacy Consult for Vancomycin and Cefepime Indication: UTI/sepsis  Allergies  Allergen Reactions  . Ivp Dye [Iodinated Diagnostic Agents] Swelling    Patient Measurements: Height:  (167.6 cm) Weight: 220 lb (99.791 kg) IBW/kg (Calculated) : 63.8  Vital Signs: Temp: 98.8 F (37.1 C) (02/24 2300) Temp Source: Oral (02/24 2300) BP: 168/76 mmHg (02/24 2300) Pulse Rate: 104 (02/24 2300)  Labs:  Recent Labs  08/23/15 0813 08/24/15 2114  WBC 14.2* 26.3*  HGB 13.6 13.1  PLT 366 335  CREATININE 1.23 1.37*    Estimated Creatinine Clearance: 59.5 mL/min (by C-G formula based on Cr of 1.37).  No results for input(s): VANCOTROUGH, VANCOPEAK, VANCORANDOM, GENTTROUGH, GENTPEAK, GENTRANDOM, TOBRATROUGH, TOBRAPEAK, TOBRARND, AMIKACINPEAK, AMIKACINTROU, AMIKACIN in the last 72 hours.   Microbiology: Recent Results (from the past 720 hour(s))  Urine culture     Status: None   Collection Time: 08/03/15  5:04 AM  Result Value Ref Range Status   Specimen Description URINE, CLEAN CATCH  Final   Special Requests NONE  Final   Culture   Final    >=100,000 COLONIES/mL AEROCOCCUS URINAE Performed at [facility]    Report Status 08/06/2015 FINAL  Final  Urine culture     Status: None (Preliminary result)   Collection Time: 08/23/15  7:55 AM  Result Value Ref Range Status   Specimen Description URINE, CLEAN CATCH  Final   Special Requests NONE  Final   Culture   Final    >=100,000 COLONIES/mL GRAM POSITIVE COCCI Performed at [facility]    Report Status PENDING  Incomplete  Culture, blood (routine x 2)     Status: None (Preliminary result)   Collection Time: 08/24/15  9:25 PM  Result Value Ref Range Status   Specimen Description BLOOD LEFT ARM  Final   Special Requests BOTTLES DRAWN AEROBIC AND ANAEROBIC [facility] EACH  Final   Culture PENDING  Incomplete   Report Status PENDING  Incomplete  Culture, blood (routine x 2)      Status: None (Preliminary result)   Collection Time: 08/24/15  9:30 PM  Result Value Ref Range Status   Specimen Description BLOOD LEFT HAND  Final   Special Requests BOTTLES DRAWN AEROBIC AND ANAEROBIC [facility] EACH  Final   Culture PENDING  Incomplete   Report Status PENDING  Incomplete    Medical History: Past Medical History  Diagnosis Date  . Diabetes mellitus without complication (HCC)   . Hypertension   . Coronary artery disease   . Peripheral arterial disease (HCC)   . Hyperlipidemia     Medications:  Ciprofloxacin 400 mg IV x 1 dose in the ED  Assessment: 65 yo male seen in the ED for 3 day history of dysuria, hematuria and flank pain. Pt s/p cystoscopy @ 1 month ago. Pt was seen 08/23/15 and diagnosed with UTI to be treated with PO cephalexin. Pt now returns with chills and fever and increased scrotal and testicular pain. Pt has WBCs elevated to 26000, is febrile and tachycardic. Empiric antibiotics to be started for sepsis secondary to epididymo-orchitis following instrumentation.  Goal of Therapy:  Vancomycin troughs 15-20 mcg/ml Eradicate infection  Plan:  Preliminary review of pertinent patient information completed.  Protocol will be initiated with one-time doses of Vancomycin 1250 mg IV and Cefepime 2 Gm IV.Marland Kitchen  Jeani Hawking clinical pharmacist will complete review during morning rounds to assess patient and finalize treatment regimen.  Arelia Sneddon, [facility] 08/24/2015,11:42 PM

## 2015-08-24 NOTE — H&P (Signed)
[facility] History and Physical  Panfilo Ketchum RUE:[id number] DOB: 1951/04/12 DOA: 08/24/2015  Referring physician: ED physician PCP: Arlina Robes, MD  Specialists:  Dr. Delford Field (urology)   Chief Complaint:  Fever, hematuria, left groin and testicular pain  HPI: Jeffrey Daugherty is a 65 y.o. male with PMH of type 2 diabetes mellitus, hypertension, hyperlipidemia, and peripheral arterial disease who presents to the ED with 3 days of dysuria, hematuria, and left-sided groin pain. Patient reports being in his usual state of health until approximately 3 days ago when he developed pain in left side of his groin, dysuria, and gross hematuria. By the following day, the symptoms were accompanied by subjective fevers, chills, and sweats. Also by that time, groin pain was beginning to localized to the left side of scrotum and left testicle. Patient denies any new sexual partners but notes that approximately one month ago, he underwent cystoscopy with Dr. Delford Field of urology in [place]. He states that this was performed to evaluate microscopic hematuria and was told that the study was normal. He does not recall of prophylactic antibiotic was used at time of that procedure. Patient was evaluated in the ED yesterday for these complaints, diagnosed with urinary tract infection, and discharged home with Keflex. Despite using the Keflex as prescribed, symptoms continued to worsen, prompting his return to the ED this evening.  In ED, patient was found to be febrile to 38.8 C, tachycardic in the low 100s, and with vitals otherwise stable. Ultrasound of the scrotum and testes was obtained and suggestive of left sided epididymoorchitis. Urine is obtained for analysis and consistent with infection, featuring many bacteria, small leukocyte, and too numerous to count white blood cells. Urine was sent for culture at time of ED visit yesterday and is growing >100,000 CFU gram-positive cocci. CBC is notable for a leukocytosis  to 26,000. Lactic acid is reassuring at 1.2. Normal saline infusion was started in the emergency department, patient's pain was treated with 2 mg IV morphine, urine and blood cultures were obtained, and empiric antibiotics in the form of ciprofloxacin were administered. Patient remained hemodynamically stable in the emergency department and will be admitted for ongoing evaluation and management of sepsis secondary to epididymoorchitis following urologic instrumentation.  Where does patient live?   At home     Can patient participate in ADLs?  Yes        Review of Systems:   General: no weight change. Fever, chills, loss of appetite, fatigue HEENT: no blurry vision, hearing changes or sore throat Pulm: no dyspnea, cough, or wheeze CV: no chest pain or palpitations Abd: no nausea, vomiting, abdominal pain, diarrhea, or constipation GU: no increased urinary frequency, or urgency. Dysuria, hematuria Ext:  Right leg s/p AKA, left leg with edema Neuro: no focal weakness, numbness, or tingling, no vision change or hearing loss Skin: no rash, superficial ulcer on left leg MSK: No muscle spasm, no deformity, no red, hot, or swollen joint Heme: No easy bruising or bleeding Travel history: No recent long distant travel    Allergy:  Allergies  Allergen Reactions  . Ivp Dye [Iodinated Diagnostic Agents] Swelling    Past Medical History  Diagnosis Date  . Diabetes mellitus without complication (HCC)   . Hypertension   . Coronary artery disease   . Peripheral arterial disease (HCC)   . Hyperlipidemia     Past Surgical History  Procedure Laterality Date  . Above knee leg amputation      right leg  . Hemorrhoid surgery  Social History:  reports that he has quit smoking. He does not have any smokeless tobacco history on file. He reports that he does not drink alcohol or use illicit drugs.  Family History: History reviewed. No pertinent family history.   Prior to Admission medications    Medication Sig Start Date End Date Taking? Authorizing Provider  aspirin 81 MG tablet Take 81 mg by mouth every morning.    Yes Historical Provider, MD  atorvastatin (LIPITOR) 40 MG tablet Take 40 mg by mouth every evening.    Yes Historical Provider, MD  cephALEXin (KEFLEX) 500 MG capsule Take 1 capsule (500 mg total) by mouth 4 (four) times daily. 08/23/15  Yes Eber Hong, MD  cloNIDine (CATAPRES) 0.3 MG tablet Take 0.3 mg by mouth 2 (two) times daily.   Yes Historical Provider, MD  ergocalciferol (VITAMIN D2) 50000 units capsule Take 50,000 Units by mouth once a week.   Yes Historical Provider, MD  ferrous sulfate 325 (65 FE) MG tablet Take 325 mg by mouth 2 (two) times daily with a meal.   Yes Historical Provider, MD  glimepiride (AMARYL) 2 MG tablet Take 2 mg by mouth daily with breakfast.   Yes Historical Provider, MD  hydrALAZINE (APRESOLINE) 25 MG tablet Take 25 mg by mouth 3 (three) times daily.   Yes Historical Provider, MD  labetalol (NORMODYNE) 200 MG tablet Take 400 mg by mouth 2 (two) times daily.   Yes Historical Provider, MD  losartan (COZAAR) 50 MG tablet Take 50 mg by mouth every morning.    Yes Historical Provider, MD  magnesium oxide (MAG-OX) 400 MG tablet Take 400 mg by mouth 2 (two) times daily.   Yes Historical Provider, MD  metFORMIN (GLUCOPHAGE) 1000 MG tablet Take 1,000 mg by mouth 2 (two) times daily with a meal.   Yes Historical Provider, MD  montelukast (SINGULAIR) 10 MG tablet Take 10 mg by mouth at bedtime.   Yes Historical Provider, MD  rivaroxaban (XARELTO) 20 MG TABS tablet Take 20 mg by mouth daily with supper.   Yes Historical Provider, MD  traMADol (ULTRAM) 50 MG tablet Take 1 tablet (50 mg total) by mouth every 6 (six) hours as needed. 08/23/15  Yes Eber Hong, MD    Physical Exam: Filed Vitals:   08/24/15 2100 08/24/15 2200 08/24/15 2230 08/24/15 2300  BP: 162/77 124/68 153/70 168/76  Pulse: 98 103 102 104  Temp:    98.8 F (37.1 C)  TempSrc:     Oral  Resp:    20  Height:      Weight:      SpO2: 95% 94% 96% 95%   General: Not in acute respiratory distress. Diaphoretic  HEENT:       Eyes: PERRL, EOMI, no scleral icterus or conjunctival pallor.       ENT: No discharge from the ears or nose, no pharyngeal ulcers, petechiae or exudate, no tonsillar enlargement.        Neck: No JVD, no bruit, no appreciable mass Heme: No cervical adenopathy, no pallor Cardiac: S1/S2, RRR, No murmurs, No gallops or rubs. Pulm: Good air movement bilaterally. No rales, wheezing, rhonchi or rubs. Abd: Soft, nondistended, nontender, no rebound pain or gaurding, BS present. GU:  Induration of left lateral scrotum with exquisite tenderness Ext:  Right leg s/p AKA; left leg edematous to knee; superficial ulceration of anterior left leg Musculoskeletal: No gross deformity, no red, hot, swollen joints   Skin: No rashes on exposed surfaces. Superficial ulcer anterior  left leg Neuro: Alert, oriented X3, cranial nerves II-XII grossly intac. No focal findings Psych: Patient is not overtly psychotic, appropriate mood and affect.  Labs on Admission:  Basic Metabolic Panel:  Recent Labs Lab 08/23/15 0813 08/24/15 2114  NA 139 137  K 4.6 4.0  CL 104 102  CO2 24 22  GLUCOSE 194* 169*  BUN 16 14  CREATININE 1.23 1.37*  CALCIUM 9.8 9.2   Liver Function Tests:  Recent Labs Lab 08/23/15 0813  AST 17  ALT 15*  ALKPHOS 92  BILITOT 0.3  PROT 7.9  ALBUMIN 4.0   No results for input(s): LIPASE, AMYLASE in the last 168 hours. No results for input(s): AMMONIA in the last 168 hours. CBC:  Recent Labs Lab 08/23/15 0813 08/24/15 2114  WBC 14.2* 26.3*  NEUTROABS 11.6* 22.3*  HGB 13.6 13.1  HCT 42.2 40.1  MCV 82.7 83.2  PLT 366 335   Cardiac Enzymes: No results for input(s): CKTOTAL, CKMB, CKMBINDEX, TROPONINI in the last 168 hours.  BNP (last 3 results) No results for input(s): BNP in the last 8760 hours.  ProBNP (last 3 results) No results  for input(s): PROBNP in the last 8760 hours.  CBG: No results for input(s): GLUCAP in the last 168 hours.  Radiological Exams on Admission: Ct Abdomen Pelvis Wo Contrast  08/23/2015  CLINICAL DATA:  Left lower quadrant abdominal pain. Hematuria last night. EXAM: CT ABDOMEN AND PELVIS WITHOUT CONTRAST TECHNIQUE: Multidetector CT imaging of the abdomen and pelvis was performed following the standard protocol without IV contrast. COMPARISON:  None. FINDINGS: Lower chest and abdominal wall: Borderline cardiomegaly. RCA atherosclerotic calcification or stent. Hepatobiliary: No focal liver abnormality.No evidence of biliary obstruction or stone. Pancreas: Unremarkable. Spleen: Unremarkable. Adrenals/Urinary Tract: Negative adrenals. No hydronephrosis or stone. Lobulated cortex of the lower pole right kidney, likely scarring. Unremarkable bladder given degree of distention. Reproductive:Mild symmetric enlargement of the prostate. Stomach/Bowel:  No obstruction. No appendicitis. Vascular/Lymphatic: Bilateral lower extremity bypass, with 3 grafts into the right thigh. No mass or adenopathy. Peritoneal: No ascites or pneumoperitoneum. Musculoskeletal: No acute abnormalities. Facet arthropathy in the lumbar spine. IMPRESSION: No explanation for acute abdominal pain. Electronically Signed   By: Marnee Spring M.D.   On: 08/23/2015 08:51   US Scrotum  08/24/2015  CLINICAL DATA:  65 year old with 1 day history of left scrotal pain. Patient currently being treated for urinary tract infection. EXAM: SCROTAL ULTRASOUND DOPPLER ULTRASOUND OF THE TESTICLES TECHNIQUE: Complete ultrasound examination of the testicles, epididymis, and other scrotal structures was performed. Color and spectral Doppler ultrasound were also utilized to evaluate blood flow to the testicles. COMPARISON:  None. FINDINGS: Right testicle Measurements: Approximately 4.2 x 2.6 x 2.4 cm. Normal parenchymal echotexture without mass or microlithiasis.  Normal color Doppler flow without evidence of hyperemia. Left testicle Measurements: Approximately 3.5 x 3.1 x 2.9 cm. Normal parenchymal echotexture without mass or microlithiasis. Hyperemia on color Doppler evaluation. Right epididymis: Normal in size and appearance without evidence of hyperemia. Left epididymis:  Enlarged with hyperemia. Hydrocele:  None visualized. Varicocele:  None visualized. Other:  Marked thickening of the skin of the scrotum. Pulsed Doppler interrogation of both testes demonstrates normal low resistance arterial and venous waveforms bilaterally. IMPRESSION: Left epididymo-orchitis. Electronically Signed   By: Hulan Saas M.D.   On: 08/24/2015 21:57   [place] Art/ven Flow Abd Pelv Doppler  08/24/2015  CLINICAL DATA:  65 year old with 1 day history of left scrotal pain. Patient currently being treated for urinary tract infection. EXAM:  SCROTAL ULTRASOUND DOPPLER ULTRASOUND OF THE TESTICLES TECHNIQUE: Complete ultrasound examination of the testicles, epididymis, and other scrotal structures was performed. Color and spectral Doppler ultrasound were also utilized to evaluate blood flow to the testicles. COMPARISON:  None. FINDINGS: Right testicle Measurements: Approximately 4.2 x 2.6 x 2.4 cm. Normal parenchymal echotexture without mass or microlithiasis. Normal color Doppler flow without evidence of hyperemia. Left testicle Measurements: Approximately 3.5 x 3.1 x 2.9 cm. Normal parenchymal echotexture without mass or microlithiasis. Hyperemia on color Doppler evaluation. Right epididymis: Normal in size and appearance without evidence of hyperemia. Left epididymis:  Enlarged with hyperemia. Hydrocele:  None visualized. Varicocele:  None visualized. Other:  Marked thickening of the skin of the scrotum. Pulsed Doppler interrogation of both testes demonstrates normal low resistance arterial and venous waveforms bilaterally. IMPRESSION: Left epididymo-orchitis. Electronically Signed   By: Hulan Saas M.D.   On: 08/24/2015 21:57    EKG:  Not done in ED, will obtain as appropriate   Assessment/Plan  1. Sepsis secondary to epididymo-orchitis  - Meets sepsis criteria on arrival with fever, tachycardia, leukocytosis  - US scrotum and testicles c/w left-sided epididymo-orchitis  - Likely secondary to cystoscopy performed within the last month by Dr. Delford Field of [facility]  - Urine culture obtained at time of ED visit 08/23/15 is growing >100k cfu GPC  - Empiric Cipro given in ED after blood cultures obtained  - Will cover healthcare-assoc UTI with empiric cefepime; given growth of GPC on urine culture, will administer empiric vancomycin while awaiting speciation  - Scrotal support, ice pack, Dilaudid prn severe pain  - Follow-up cultures, trend lactate, check procalcitonin  2. Hypertension  - At goal currently  - Will cautiously continue home-dose clonidine, labetalol, losartan   3. Type II DM  - Managed with glipizide and metformin at home; no A1c on file  - Hold home meds while inpt  - Institute a moderate-intensity SSI correctional, adjust prn  - CBG with meals and qHS  - Carb-modified diet when appropriate  - Check A1c, pending   4. CKD stage III  - SCr 1.37 on admission, consistent with prior measurements in our EMR  - Gently hydrating with NS in setting of sepsis  - Avoid nephrotoxic agents where possible  - Repeat chem panel tomorrow    DVT ppx: Continue Xarelto  Code Status: Full code Family Communication:  Yes, patient's wife and sister at bed side Disposition Plan: Admit to inpatient   Date of Service 08/24/2015    Briscoe Deutscher, MD [facility] Pager [phone]  If 7PM-7AM, please contact night-coverage [email or web address] Password [email or web address] 08/24/2015, 11:22 PM

## 2015-08-25 ENCOUNTER — Encounter (HOSPITAL_COMMUNITY): Payer: Self-pay

## 2015-08-25 DIAGNOSIS — N453 Epididymo-orchitis: Secondary | ICD-10-CM

## 2015-08-25 DIAGNOSIS — A4102 Sepsis due to Methicillin resistant Staphylococcus aureus: Secondary | ICD-10-CM

## 2015-08-25 LAB — GLUCOSE, CAPILLARY
GLUCOSE-CAPILLARY: 155 mg/dL — AB (ref 65–99)
Glucose-Capillary: 173 mg/dL — ABNORMAL HIGH (ref 65–99)
Glucose-Capillary: 167 mg/dL — ABNORMAL HIGH (ref 65–99)

## 2015-08-25 LAB — BASIC METABOLIC PANEL
Anion gap: 11 (ref 5–15)
BUN: 14 mg/dL (ref 6–20)
CO2: 24 mmol/L (ref 22–32)
CREATININE: 1.29 mg/dL — AB (ref 0.61–1.24)
Calcium: 9 mg/dL (ref 8.9–10.3)
Chloride: 105 mmol/L (ref 101–111)
GFR calc Af Amer: >60 mL/min (ref >60)
GFR, EST NON AFRICAN AMERICAN: 57 mL/min — AB (ref >60)
GLUCOSE: 183 mg/dL — AB (ref 65–99)
POTASSIUM: 3.9 mmol/L (ref 3.5–5.1)
SODIUM: 140 mmol/L (ref 135–145)

## 2015-08-25 LAB — PROCALCITONIN: PROCALCITONIN: 0.16 ng/mL

## 2015-08-25 LAB — LACTIC ACID, PLASMA: Lactic Acid, Venous: 1.3 mmol/L (ref 0.5–2.0)

## 2015-08-25 LAB — CBC WITH DIFFERENTIAL/PLATELET
BASOS ABS: 0 10*3/uL (ref 0.0–0.1)
Basophils Relative: 0 %
EOS ABS: 0.1 10*3/uL (ref 0.0–0.7)
EOS PCT: 1 %
HCT: 38.7 % — ABNORMAL LOW (ref 39.0–52.0)
Hemoglobin: 12.3 g/dL — ABNORMAL LOW (ref 13.0–17.0)
LYMPHS ABS: 1.9 10*3/uL (ref 0.7–4.0)
LYMPHS PCT: 9 %
MCH: 26.6 pg (ref 26.0–34.0)
MCHC: 31.8 g/dL (ref 30.0–36.0)
MCV: 83.6 fL (ref 78.0–100.0)
MONO ABS: 1.2 10*3/uL — AB (ref 0.1–1.0)
Monocytes Relative: 5 %
Neutro Abs: 18.2 10*3/uL — ABNORMAL HIGH (ref 1.7–7.7)
Neutrophils Relative %: 85 %
PLATELETS: 330 10*3/uL (ref 150–400)
RBC: 4.63 MIL/uL (ref 4.22–5.81)
RDW: 14.3 % (ref 11.5–15.5)
WBC: 21.4 10*3/uL — ABNORMAL HIGH (ref 4.0–10.5)

## 2015-08-25 LAB — URINE CULTURE

## 2015-08-25 MED ORDER — CEFEPIME HCL 1 G IJ SOLR
1.0000 g | Freq: Three times a day (TID) | INTRAMUSCULAR | Status: DC
  Administered 2015-08-25 – 2015-08-26 (×4): 1 g via INTRAVENOUS
  Filled 2015-08-25 (×7): qty 1

## 2015-08-25 MED ORDER — VANCOMYCIN HCL IN DEXTROSE 1-5 GM/200ML-% IV SOLN
1000.0000 mg | Freq: Two times a day (BID) | INTRAVENOUS | Status: DC
  Administered 2015-08-25 – 2015-08-26 (×3): 1000 mg via INTRAVENOUS
  Filled 2015-08-25 (×2): qty 200

## 2015-08-25 MED ORDER — INFLUENZA VAC SPLIT QUAD 0.5 ML IM SUSY: 0.5000 mL | PREFILLED_SYRINGE | INTRAMUSCULAR | Status: DC

## 2015-08-25 NOTE — Progress Notes (Signed)
ANTIBIOTIC CONSULT NOTE-  Pharmacy Consult for Vancomycin and Cefepime Indication: UTI/sepsis  Allergies  Allergen Reactions  . Ivp Dye [Iodinated Diagnostic Agents] Swelling   PLAN:  Vancomycin 1gm IV q12hrs  Check trough at steady state  Cefepime 1gm IV q8h  Monitor labs, renal fxn, c/s   Patient Measurements: Height:  (167.6 cm) Weight: 206 lb 9.6 oz (93.713 kg) IBW/kg (Calculated) : 63.8  Vital Signs: Temp: 98.6 F (37 C) (02/25 0118) Temp Source: Oral (02/25 0118) BP: 157/65 mmHg (02/25 0930) Pulse Rate: 98 (02/25 0930)  Labs:  Recent Labs  08/23/15 0813 08/24/15 2114 08/25/15 0618  WBC 14.2* 26.3* 21.4*  HGB 13.6 13.1 12.3*  PLT 366 335 330  CREATININE 1.23 1.37* 1.29*   Estimated Creatinine Clearance: 61.2 mL/min (by C-G formula based on Cr of 1.29).  No results for input(s): VANCOTROUGH, VANCOPEAK, VANCORANDOM, GENTTROUGH, GENTPEAK, GENTRANDOM, TOBRATROUGH, TOBRAPEAK, TOBRARND, AMIKACINPEAK, AMIKACINTROU, AMIKACIN in the last 72 hours.   Microbiology: Recent Results (from the past 720 hour(s))  Urine culture     Status: None   Collection Time: 08/03/15  5:04 AM  Result Value Ref Range Status   Specimen Description URINE, CLEAN CATCH  Final   Special Requests NONE  Final   Culture   Final    >=100,000 COLONIES/mL AEROCOCCUS URINAE Performed at [facility]    Report Status 08/06/2015 FINAL  Final  Urine culture     Status: None (Preliminary result)   Collection Time: 08/23/15  7:55 AM  Result Value Ref Range Status   Specimen Description URINE, CLEAN CATCH  Final   Special Requests NONE  Final   Culture   Final    >=100,000 COLONIES/mL GRAM POSITIVE COCCI Performed at [facility]    Report Status PENDING  Incomplete  Culture, blood (routine x 2)     Status: None (Preliminary result)   Collection Time: 08/24/15  9:25 PM  Result Value Ref Range Status   Specimen Description BLOOD LEFT ARM  Final   Special Requests  BOTTLES DRAWN AEROBIC AND ANAEROBIC [facility] EACH  Final   Culture PENDING  Incomplete   Report Status PENDING  Incomplete  Culture, blood (routine x 2)     Status: None (Preliminary result)   Collection Time: 08/24/15  9:30 PM  Result Value Ref Range Status   Specimen Description BLOOD LEFT HAND  Final   Special Requests BOTTLES DRAWN AEROBIC AND ANAEROBIC [facility] EACH  Final   Culture PENDING  Incomplete   Report Status PENDING  Incomplete   Medical History: Past Medical History  Diagnosis Date  . Diabetes mellitus without complication (HCC)   . Hypertension   . Coronary artery disease   . Peripheral arterial disease (HCC)   . Hyperlipidemia    Assessment: 65 yo male seen in the ED for 3 day history of dysuria, hematuria and flank pain. Pt s/p cystoscopy @ 1 month ago. Pt was seen 08/23/15 and diagnosed with UTI to be treated with PO cephalexin. Pt now returns with chills and fever and increased scrotal and testicular pain. Pt has WBCs elevated to 26000, is febrile and tachycardic. Empiric antibiotics to be started for sepsis secondary to epididymo-orchitis following instrumentation.  Goal of Therapy:  Vancomycin troughs 15-20 mcg/ml Eradicate infection  Wayland Denis, RPH 08/25/2015,10:04 AM

## 2015-08-25 NOTE — Progress Notes (Addendum)
[facility] PROGRESS NOTE  Jeffrey Daugherty ZOX:[id number] DOB: [birth year] DOA: 08/24/2015 PCP: Arlina Robes, MD  Assessment/Plan: Epididymo-orchitis -Urine culture with gram-positive cocci. -Continue vancomycin pending final sensitivities. -Discontinue cefepime. -Leukocytosis improving from 26-21,000.  Stage III chronic kidney disease -Creatinine is at baseline.  Hypertension -Fair control.  Type 2 diabetes -Fair control, continue current management.  Code Status: Full code Family Communication: Wife and son at bedside updated of plan of care and all questions answered  Disposition Plan: Home when ready   Consultants:  None   Antibiotics:  Vancomycin   Subjective: Feels well, left testicular pain is improved  Objective: Filed Vitals:   08/25/15 0030 08/25/15 0118 08/25/15 0930 08/25/15 1403  BP: 159/86 176/81 157/65 117/57  Pulse: 91 107 98 85  Temp:  98.6 F (37 C)  99.2 F (37.3 C)  TempSrc:  Oral  Oral  Resp:  Height:   (1.676 m)    Weight:  93.713 kg (206 lb 9.6 oz)    SpO2: 96% 92% 97% 96%    Intake/Output Summary (Last 24 hours) at 08/25/15 1652 Last data filed at 08/25/15 0612  Gross per 24 hour  Intake      0 ml  Output    500 ml  Net   -500 ml   Filed Weights   08/24/15 1713 08/25/15 0118  Weight: 99.791 kg (220 lb) 93.713 kg (206 lb 9.6 oz)    Exam:   General:  Alert, awake, oriented 3  Cardiovascular: Regular rate and rhythm, no murmurs, rubs or gallops  Respiratory: Clear to auscultation bilaterally  Abdomen: Soft, nontender, nondistended, positive bowel sounds  Extremities: No clubbing, cyanosis or edema, positive pulses   Neurologic:  Grossly intact and nonfocal  Data Reviewed: Basic Metabolic Panel:  Recent Labs Lab 08/23/15 0813 08/24/15 2114 08/25/15 0618  NA 139 137 140  K 4.6 4.0 3.9  CL 104 102 105  CO2 GLUCOSE 194* 169* 183*  BUN CREATININE 1.23 1.37*  1.29*  CALCIUM 9.8 9.2 9.0   Liver Function Tests:  Recent Labs Lab 08/23/15 0813  AST 17  ALT 15*  ALKPHOS 92  BILITOT 0.3  PROT 7.9  ALBUMIN 4.0   No results for input(s): LIPASE, AMYLASE in the last 168 hours. No results for input(s): AMMONIA in the last 168 hours. CBC:  Recent Labs Lab 08/23/15 0813 08/24/15 2114 08/25/15 0618  WBC 14.2* 26.3* 21.4*  NEUTROABS 11.6* 22.3* 18.2*  HGB 13.6 13.1 12.3*  HCT 42.2 40.1 38.7*  MCV 82.7 83.2 83.6  PLT 366 335 330   Cardiac Enzymes: No results for input(s): CKTOTAL, CKMB, CKMBINDEX, TROPONINI in the last 168 hours. BNP (last 3 results) No results for input(s): BNP in the last 8760 hours.  ProBNP (last 3 results) No results for input(s): PROBNP in the last 8760 hours.  CBG:  Recent Labs Lab 08/25/15 0755 08/25/15 1208  GLUCAP 155* 173*    Recent Results (from the past 240 hour(s))  Urine culture     Status: None   Collection Time: 08/23/15  7:55 AM  Result Value Ref Range Status   Specimen Description URINE, CLEAN CATCH  Final   Special Requests NONE  Final   Culture   Final    >=100,000 COLONIES/mL STAPHYLOCOCCUS AUREUS Performed at [facility]    Report Status 08/25/2015 FINAL  Final   Organism ID, Bacteria STAPHYLOCOCCUS AUREUS  Final  Susceptibility   Staphylococcus aureus - MIC*    CIPROFLOXACIN >=8 RESISTANT Resistant     GENTAMICIN <=0.5 SENSITIVE Sensitive     NITROFURANTOIN <=16 SENSITIVE Sensitive     OXACILLIN <=0.25 SENSITIVE Sensitive     TETRACYCLINE <=1 SENSITIVE Sensitive     VANCOMYCIN 1 SENSITIVE Sensitive     TRIMETH/SULFA <=10 SENSITIVE Sensitive     CLINDAMYCIN <=0.25 RESISTANT Resistant     RIFAMPIN <=0.5 SENSITIVE Sensitive     Inducible Clindamycin POSITIVE Resistant     * >=100,000 COLONIES/mL STAPHYLOCOCCUS AUREUS  Culture, blood (routine x 2)     Status: None (Preliminary result)   Collection Time: 08/24/15  9:25 PM  Result Value Ref Range Status   Specimen  Description BLOOD LEFT ARM  Final   Special Requests BOTTLES DRAWN AEROBIC AND ANAEROBIC [facility] EACH  Final   Culture PENDING  Incomplete   Report Status PENDING  Incomplete  Culture, blood (routine x 2)     Status: None (Preliminary result)   Collection Time: 08/24/15  9:30 PM  Result Value Ref Range Status   Specimen Description BLOOD LEFT HAND  Final   Special Requests BOTTLES DRAWN AEROBIC AND ANAEROBIC [facility] EACH  Final   Culture PENDING  Incomplete   Report Status PENDING  Incomplete     Studies: US Scrotum  08/24/2015  CLINICAL DATA:  65 year old with 1 day history of left scrotal pain. Patient currently being treated for urinary tract infection. EXAM: SCROTAL ULTRASOUND DOPPLER ULTRASOUND OF THE TESTICLES TECHNIQUE: Complete ultrasound examination of the testicles, epididymis, and other scrotal structures was performed. Color and spectral Doppler ultrasound were also utilized to evaluate blood flow to the testicles. COMPARISON:  None. FINDINGS: Right testicle Measurements: Approximately 4.2 x 2.6 x 2.4 cm. Normal parenchymal echotexture without mass or microlithiasis. Normal color Doppler flow without evidence of hyperemia. Left testicle Measurements: Approximately 3.5 x 3.1 x 2.9 cm. Normal parenchymal echotexture without mass or microlithiasis. Hyperemia on color Doppler evaluation. Right epididymis: Normal in size and appearance without evidence of hyperemia. Left epididymis:  Enlarged with hyperemia. Hydrocele:  None visualized. Varicocele:  None visualized. Other:  Marked thickening of the skin of the scrotum. Pulsed Doppler interrogation of both testes demonstrates normal low resistance arterial and venous waveforms bilaterally. IMPRESSION: Left epididymo-orchitis. Electronically Signed   By: Hulan Saas M.D.   On: 08/24/2015 21:57   [place] Art/ven Flow Abd Pelv Doppler  08/24/2015  CLINICAL DATA:  65 year old with 1 day history of left scrotal pain. Patient currently being treated for  urinary tract infection. EXAM: SCROTAL ULTRASOUND DOPPLER ULTRASOUND OF THE TESTICLES TECHNIQUE: Complete ultrasound examination of the testicles, epididymis, and other scrotal structures was performed. Color and spectral Doppler ultrasound were also utilized to evaluate blood flow to the testicles. COMPARISON:  None. FINDINGS: Right testicle Measurements: Approximately 4.2 x 2.6 x 2.4 cm. Normal parenchymal echotexture without mass or microlithiasis. Normal color Doppler flow without evidence of hyperemia. Left testicle Measurements: Approximately 3.5 x 3.1 x 2.9 cm. Normal parenchymal echotexture without mass or microlithiasis. Hyperemia on color Doppler evaluation. Right epididymis: Normal in size and appearance without evidence of hyperemia. Left epididymis:  Enlarged with hyperemia. Hydrocele:  None visualized. Varicocele:  None visualized. Other:  Marked thickening of the skin of the scrotum. Pulsed Doppler interrogation of both testes demonstrates normal low resistance arterial and venous waveforms bilaterally. IMPRESSION: Left epididymo-orchitis. Electronically Signed   By: Hulan Saas M.D.   On: 08/24/2015 21:57    Scheduled Meds: .  aspirin EC  81 mg Oral q morning - 10a  . atorvastatin  40 mg Oral QPM  . ceFEPime (MAXIPIME) IV  1 g Intravenous Q8H  . cloNIDine  0.3 mg Oral BID  . ferrous sulfate  325 mg Oral BID WC  . hydrALAZINE  25 mg Oral TID  . [START ON 08/26/2015] Influenza vac split quadrivalent PF  0.5 mL Intramuscular Tomorrow-1000  . insulin aspart  0-15 Units Subcutaneous TID WC  . labetalol  400 mg Oral BID  . losartan  50 mg Oral q morning - 10a  . magnesium oxide  400 mg Oral BID  . montelukast  10 mg Oral QHS  . rivaroxaban  20 mg Oral Q supper  . vancomycin  1,000 mg Intravenous Q12H  . Vitamin D (Ergocalciferol)  50,000 Units Oral Weekly   Continuous Infusions:   Principal Problem:   Sepsis (HCC) Active Problems:   Epididymoorchitis   Hypertension   Type II  diabetes mellitus (HCC)   CKD stage 3 secondary to diabetes (HCC)    Time spent: 25 minutes. Greater than 50% of this time was spent in direct contact with the patient coordinating care.    Chaya Jan  [facility] Pager [phone]  If 7PM-7AM, please contact night-coverage at [email or web address], password [facility] 08/25/2015, 4:52 PM  LOS: 1 day

## 2015-08-26 DIAGNOSIS — A4101 Sepsis due to Methicillin susceptible Staphylococcus aureus: Secondary | ICD-10-CM

## 2015-08-26 LAB — GLUCOSE, CAPILLARY
GLUCOSE-CAPILLARY: 142 mg/dL — AB (ref 65–99)
GLUCOSE-CAPILLARY: 229 mg/dL — AB (ref 65–99)
Glucose-Capillary: 159 mg/dL — ABNORMAL HIGH (ref 65–99)

## 2015-08-26 LAB — BASIC METABOLIC PANEL
ANION GAP: 7 (ref 5–15)
BUN: 16 mg/dL (ref 6–20)
CALCIUM: 8.5 mg/dL — AB (ref 8.9–10.3)
CHLORIDE: 107 mmol/L (ref 101–111)
CO2: 26 mmol/L (ref 22–32)
Creatinine, Ser: 1.16 mg/dL (ref 0.61–1.24)
GFR calc non Af Amer: >60 mL/min (ref >60)
Glucose, Bld: 199 mg/dL — ABNORMAL HIGH (ref 65–99)
POTASSIUM: 3.8 mmol/L (ref 3.5–5.1)
Sodium: 140 mmol/L (ref 135–145)

## 2015-08-26 LAB — CBC
HEMATOCRIT: 36.2 % — AB (ref 39.0–52.0)
HEMOGLOBIN: 11.4 g/dL — AB (ref 13.0–17.0)
MCH: 26.4 pg (ref 26.0–34.0)
MCHC: 31.5 g/dL (ref 30.0–36.0)
MCV: 83.8 fL (ref 78.0–100.0)
Platelets: 310 10*3/uL (ref 150–400)
RBC: 4.32 MIL/uL (ref 4.22–5.81)
RDW: 14.1 % (ref 11.5–15.5)
WBC: 10.8 10*3/uL — ABNORMAL HIGH (ref 4.0–10.5)

## 2015-08-26 MED ORDER — SULFAMETHOXAZOLE-TRIMETHOPRIM 800-160 MG PO TABS: 1.0000 | ORAL_TABLET | Freq: Two times a day (BID) | ORAL | Status: AC

## 2015-08-26 NOTE — Progress Notes (Signed)
AVS reviewed with patient.  Verbalized understanding of discharge instructions, physician follow-up, and medications.  Prescription given to patient.  Patient's IV removed.  Site WNL.  Patient reports belongings intact and in possession at time of discharge.  Patient transported by NT via w/c to main entrance for discharge.  Patient transported home by wife in private vehicle.  Patient stable at time of discharge.

## 2015-08-26 NOTE — Discharge Summary (Signed)
Physician Discharge Summary  Jeffrey Daugherty ZOX:[id number] DOB: 07/28/1950 DOA: 08/24/2015  PCP: Arlina Robes, MD  Admit date: 08/24/2015 Discharge date: 08/26/2015  Time spent: 45 minutes  Recommendations for Outpatient Follow-up:  -Will be discharged home today. -Advised  To follow up with urologist in 1 week.  Discharge Diagnoses:  Principal Problem:   Sepsis (HCC) Active Problems:   Epididymoorchitis   Hypertension   Type II diabetes mellitus (HCC)   CKD stage 3 secondary to diabetes [facility])   Discharge Condition: Stable and improved  Filed Weights   08/24/15 1713 08/25/15 0118 08/26/15 0616  Weight: 99.791 kg (220 lb) 93.713 kg (206 lb 9.6 oz) 93.5 kg (206 lb 2.1 oz)    History of present illness:  As per Dr, Antionette Char on 2/24: Jeffrey Daugherty is a 65 y.o. male with PMH of type 2 diabetes mellitus, hypertension, hyperlipidemia, and peripheral arterial disease who presents to the ED with 3 days of dysuria, hematuria, and left-sided groin pain. Patient reports being in his usual state of health until approximately 3 days ago when he developed pain in left side of his groin, dysuria, and gross hematuria. By the following day, the symptoms were accompanied by subjective fevers, chills, and sweats. Also by that time, groin pain was beginning to localized to the left side of scrotum and left testicle. Patient denies any new sexual partners but notes that approximately one month ago, he underwent cystoscopy with Dr. Delford Field of urology in [place]. He states that this was performed to evaluate microscopic hematuria and was told that the study was normal. He does not recall of prophylactic antibiotic was used at time of that procedure. Patient was evaluated in the ED yesterday for these complaints, diagnosed with urinary tract infection, and discharged home with Keflex. Despite using the Keflex as prescribed, symptoms continued to worsen, prompting his return to the ED this evening.  In ED,  patient was found to be febrile to 38.8 C, tachycardic in the low 100s, and with vitals otherwise stable. Ultrasound of the scrotum and testes was obtained and suggestive of left sided epididymoorchitis. Urine is obtained for analysis and consistent with infection, featuring many bacteria, small leukocyte, and too numerous to count white blood cells. Urine was sent for culture at time of ED visit yesterday and is growing >100,000 CFU gram-positive cocci. CBC is notable for a leukocytosis to 26,000. Lactic acid is reassuring at 1.2. Normal saline infusion was started in the emergency department, patient's pain was treated with 2 mg IV morphine, urine and blood cultures were obtained, and empiric antibiotics in the form of ciprofloxacin were administered. Patient remained hemodynamically stable in the emergency department and will be admitted for ongoing evaluation and management of sepsis secondary to epididymoorchitis following urologic instrumentation.  Hospital Course:   Epididymo-orchitis -Urine culture with MSSA. -Will Dc on 10 days of Bactrim DS. -Discontinue cefepime. -Leukocytosis with marked improvement from 26 to 10.8.  Stage III chronic kidney disease -Creatinine is at baseline.  Hypertension -Fair control.  Type 2 diabetes -Fair control, continue current management.  Procedures:  None   Consultations:  None  Discharge Instructions  Discharge Instructions    Diet - low sodium heart healthy    Complete by:  As directed      Increase activity slowly    Complete by:  As directed             Medication List    STOP taking these medications  cephALEXin 500 MG capsule  Commonly known as:  KEFLEX      TAKE these medications        aspirin 81 MG tablet  Take 81 mg by mouth every morning.     atorvastatin 40 MG tablet  Commonly known as:  LIPITOR  Take 40 mg by mouth every evening.     cloNIDine 0.3 MG tablet  Commonly known as:  CATAPRES  Take 0.3 mg  by mouth 2 (two) times daily.     ergocalciferol 50000 units capsule  Commonly known as:  VITAMIN D2  Take 50,000 Units by mouth once a week.     ferrous sulfate 325 (65 FE) MG tablet  Take 325 mg by mouth 2 (two) times daily with a meal.     glimepiride 2 MG tablet  Commonly known as:  AMARYL  Take 2 mg by mouth daily with breakfast.     hydrALAZINE 25 MG tablet  Commonly known as:  APRESOLINE  Take 25 mg by mouth 3 (three) times daily.     labetalol 200 MG tablet  Commonly known as:  NORMODYNE  Take 400 mg by mouth 2 (two) times daily.     losartan 50 MG tablet  Commonly known as:  COZAAR  Take 50 mg by mouth every morning.     magnesium oxide 400 MG tablet  Commonly known as:  MAG-OX  Take 400 mg by mouth 2 (two) times daily.     metFORMIN 1000 MG tablet  Commonly known as:  GLUCOPHAGE  Take 1,000 mg by mouth 2 (two) times daily with a meal.     montelukast 10 MG tablet  Commonly known as:  SINGULAIR  Take 10 mg by mouth at bedtime.     rivaroxaban 20 MG Tabs tablet  Commonly known as:  XARELTO  Take 20 mg by mouth daily with supper.     sulfamethoxazole-trimethoprim 800-160 MG tablet  Commonly known as:  BACTRIM DS  Take 1 tablet by mouth 2 (two) times daily.     traMADol 50 MG tablet  Commonly known as:  ULTRAM  Take 1 tablet (50 mg total) by mouth every 6 (six) hours as needed.       Allergies  Allergen Reactions  . Ivp Dye [Iodinated Diagnostic Agents] Swelling       Follow-up Information    Schedule an appointment as soon as possible for a visit in 1 week to follow up.   Why:  with your urologist       The results of significant diagnostics from this hospitalization (including imaging, microbiology, ancillary and laboratory) are listed below for reference.    Significant Diagnostic Studies: Ct Abdomen Pelvis Wo Contrast  08/23/2015  CLINICAL DATA:  Left lower quadrant abdominal pain. Hematuria last night. EXAM: CT ABDOMEN AND PELVIS WITHOUT  CONTRAST TECHNIQUE: Multidetector CT imaging of the abdomen and pelvis was performed following the standard protocol without IV contrast. COMPARISON:  None. FINDINGS: Lower chest and abdominal wall: Borderline cardiomegaly. RCA atherosclerotic calcification or stent. Hepatobiliary: No focal liver abnormality.No evidence of biliary obstruction or stone. Pancreas: Unremarkable. Spleen: Unremarkable. Adrenals/Urinary Tract: Negative adrenals. No hydronephrosis or stone. Lobulated cortex of the lower pole right kidney, likely scarring. Unremarkable bladder given degree of distention. Reproductive:Mild symmetric enlargement of the prostate. Stomach/Bowel:  No obstruction. No appendicitis. Vascular/Lymphatic: Bilateral lower extremity bypass, with 3 grafts into the right thigh. No mass or adenopathy. Peritoneal: No ascites or pneumoperitoneum. Musculoskeletal: No acute abnormalities. Facet arthropathy in  the lumbar spine. IMPRESSION: No explanation for acute abdominal pain. Electronically Signed   By: Marnee Spring M.D.   On: 08/23/2015 08:51   US Scrotum  08/24/2015  CLINICAL DATA:  65 year old with 1 day history of left scrotal pain. Patient currently being treated for urinary tract infection. EXAM: SCROTAL ULTRASOUND DOPPLER ULTRASOUND OF THE TESTICLES TECHNIQUE: Complete ultrasound examination of the testicles, epididymis, and other scrotal structures was performed. Color and spectral Doppler ultrasound were also utilized to evaluate blood flow to the testicles. COMPARISON:  None. FINDINGS: Right testicle Measurements: Approximately 4.2 x 2.6 x 2.4 cm. Normal parenchymal echotexture without mass or microlithiasis. Normal color Doppler flow without evidence of hyperemia. Left testicle Measurements: Approximately 3.5 x 3.1 x 2.9 cm. Normal parenchymal echotexture without mass or microlithiasis. Hyperemia on color Doppler evaluation. Right epididymis: Normal in size and appearance without evidence of hyperemia. Left  epididymis:  Enlarged with hyperemia. Hydrocele:  None visualized. Varicocele:  None visualized. Other:  Marked thickening of the skin of the scrotum. Pulsed Doppler interrogation of both testes demonstrates normal low resistance arterial and venous waveforms bilaterally. IMPRESSION: Left epididymo-orchitis. Electronically Signed   By: Hulan Saas M.D.   On: 08/24/2015 21:57   [place] Art/ven Flow Abd Pelv Doppler  08/24/2015  CLINICAL DATA:  65 year old with 1 day history of left scrotal pain. Patient currently being treated for urinary tract infection. EXAM: SCROTAL ULTRASOUND DOPPLER ULTRASOUND OF THE TESTICLES TECHNIQUE: Complete ultrasound examination of the testicles, epididymis, and other scrotal structures was performed. Color and spectral Doppler ultrasound were also utilized to evaluate blood flow to the testicles. COMPARISON:  None. FINDINGS: Right testicle Measurements: Approximately 4.2 x 2.6 x 2.4 cm. Normal parenchymal echotexture without mass or microlithiasis. Normal color Doppler flow without evidence of hyperemia. Left testicle Measurements: Approximately 3.5 x 3.1 x 2.9 cm. Normal parenchymal echotexture without mass or microlithiasis. Hyperemia on color Doppler evaluation. Right epididymis: Normal in size and appearance without evidence of hyperemia. Left epididymis:  Enlarged with hyperemia. Hydrocele:  None visualized. Varicocele:  None visualized. Other:  Marked thickening of the skin of the scrotum. Pulsed Doppler interrogation of both testes demonstrates normal low resistance arterial and venous waveforms bilaterally. IMPRESSION: Left epididymo-orchitis. Electronically Signed   By: Hulan Saas M.D.   On: 08/24/2015 21:57    Microbiology: Recent Results (from the past 240 hour(s))  Urine culture     Status: None   Collection Time: 08/23/15  7:55 AM  Result Value Ref Range Status   Specimen Description URINE, CLEAN CATCH  Final   Special Requests NONE  Final   Culture    Final    >=100,000 COLONIES/mL STAPHYLOCOCCUS AUREUS Performed at [facility]    Report Status 08/25/2015 FINAL  Final   Organism ID, Bacteria STAPHYLOCOCCUS AUREUS  Final      Susceptibility   Staphylococcus aureus - MIC*    CIPROFLOXACIN >=8 RESISTANT Resistant     GENTAMICIN <=0.5 SENSITIVE Sensitive     NITROFURANTOIN <=16 SENSITIVE Sensitive     OXACILLIN <=0.25 SENSITIVE Sensitive     TETRACYCLINE <=1 SENSITIVE Sensitive     VANCOMYCIN 1 SENSITIVE Sensitive     TRIMETH/SULFA <=10 SENSITIVE Sensitive     CLINDAMYCIN <=0.25 RESISTANT Resistant     RIFAMPIN <=0.5 SENSITIVE Sensitive     Inducible Clindamycin POSITIVE Resistant     * >=100,000 COLONIES/mL STAPHYLOCOCCUS AUREUS  Culture, blood (routine x 2)     Status: None (Preliminary result)   Collection Time: 08/24/15  9:25 PM  Result Value Ref Range Status   Specimen Description BLOOD LEFT ARM  Final   Special Requests BOTTLES DRAWN AEROBIC AND ANAEROBIC [facility] EACH  Final   Culture PENDING  Incomplete   Report Status PENDING  Incomplete  Culture, blood (routine x 2)     Status: None (Preliminary result)   Collection Time: 08/24/15  9:30 PM  Result Value Ref Range Status   Specimen Description BLOOD LEFT HAND  Final   Special Requests BOTTLES DRAWN AEROBIC AND ANAEROBIC [facility] EACH  Final   Culture PENDING  Incomplete   Report Status PENDING  Incomplete     Labs: Basic Metabolic Panel:  Recent Labs Lab 08/23/15 0813 08/24/15 2114 08/25/15 0618 08/26/15 0611  NA 139 137 140 140  K 4.6 4.0 3.9 3.8  CL 104 102 105 107  CO2 24 22 24 26   GLUCOSE 194* 169* 183* 199*  BUN 16 14 14 16   CREATININE 1.23 1.37* 1.29* 1.16  CALCIUM 9.8 9.2 9.0 8.5*   Liver Function Tests:  Recent Labs Lab 08/23/15 0813  AST 17  ALT 15*  ALKPHOS 92  BILITOT 0.3  PROT 7.9  ALBUMIN 4.0   No results for input(s): LIPASE, AMYLASE in the last 168 hours. No results for input(s): AMMONIA in the last 168 hours. CBC:  Recent  Labs Lab 08/23/15 0813 08/24/15 2114 08/25/15 0618 08/26/15 0611  WBC 14.2* 26.3* 21.4* 10.8*  NEUTROABS 11.6* 22.3* 18.2*  --   HGB 13.6 13.1 12.3* 11.4*  HCT 42.2 40.1 38.7* 36.2*  MCV 82.7 83.2 83.6 83.8  PLT 366 335 330 310   Cardiac Enzymes: No results for input(s): CKTOTAL, CKMB, CKMBINDEX, TROPONINI in the last 168 hours. BNP: BNP (last 3 results) No results for input(s): BNP in the last 8760 hours.  ProBNP (last 3 results) No results for input(s): PROBNP in the last 8760 hours.  CBG:  Recent Labs Lab 08/25/15 1208 08/25/15 1652 08/25/15 2254 08/26/15 0826 08/26/15 1202  GLUCAP 173* 167* 159* 142* 229*       Signed:  HERNANDEZ ACOSTA,ESTELA  [facility] Pager: [phone] 08/26/2015, 12:54 PM

## 2015-08-27 ENCOUNTER — Telehealth (HOSPITAL_COMMUNITY): Payer: Self-pay

## 2015-08-27 LAB — URINE CULTURE: Culture: NO GROWTH

## 2015-08-27 LAB — HEMOGLOBIN A1C
Hgb A1c MFr Bld: 11.1 % — ABNORMAL HIGH (ref 4.8–5.6)
Mean Plasma Glucose: 272 mg/dL

## 2015-08-27 NOTE — Telephone Encounter (Addendum)
Post ED Visit - Positive Culture Follow-up  Culture report reviewed by antimicrobial stewardship pharmacist:   Enzo Bi, Pharm.D.  Celedonio Miyamoto, [address].D., BCPS  Garvin Fila, Pharm.D.  Georgina Pillion, Pharm.D., BCPS  [place], [place].D., BCPS, AAHIVP  Estella Husk, Pharm.D., BCPS, AAHIVP  Tennis Must, Pharm.D.  Sherle Poe, [address].D. Carmon Sails Rumbarger, Pharm.D.  Positive urine culture, >/= 100,000 colonies -> Staph Species Admitted to [facility] 08/24/2015 and dcd 08/26/2015 dcd w/Rx for Sulfa Trimeth -> sensitive to the same  Arvid Right 08/27/2015, 5:49 AM

## 2015-08-29 LAB — CULTURE, BLOOD (ROUTINE X 2)
CULTURE: NO GROWTH
Culture: NO GROWTH

## 2015-09-01 ENCOUNTER — Emergency Department (HOSPITAL_COMMUNITY): Payer: Medicare Other

## 2015-09-01 ENCOUNTER — Emergency Department (HOSPITAL_COMMUNITY)
Admission: EM | Admit: 2015-09-01 | Discharge: 2015-09-01 | Disposition: A | Discharge status: Home / Self Care | Payer: Medicare Other | Attending: Emergency Medicine | Admitting: Emergency Medicine

## 2015-09-01 ENCOUNTER — Encounter (HOSPITAL_COMMUNITY): Payer: Self-pay | Admitting: Emergency Medicine

## 2015-09-01 DIAGNOSIS — R1031 Right lower quadrant pain: Secondary | ICD-10-CM | POA: Diagnosis present

## 2015-09-01 DIAGNOSIS — E119 Type 2 diabetes mellitus without complications: Secondary | ICD-10-CM | POA: Insufficient documentation

## 2015-09-01 DIAGNOSIS — I739 Peripheral vascular disease, unspecified: Secondary | ICD-10-CM | POA: Diagnosis not present

## 2015-09-01 DIAGNOSIS — Z7982 Long term (current) use of aspirin: Secondary | ICD-10-CM | POA: Diagnosis not present

## 2015-09-01 DIAGNOSIS — Z87891 Personal history of nicotine dependence: Secondary | ICD-10-CM | POA: Diagnosis not present

## 2015-09-01 DIAGNOSIS — I2510 Atherosclerotic heart disease of native coronary artery without angina pectoris: Secondary | ICD-10-CM | POA: Diagnosis not present

## 2015-09-01 DIAGNOSIS — Z79899 Other long term (current) drug therapy: Secondary | ICD-10-CM | POA: Diagnosis not present

## 2015-09-01 DIAGNOSIS — E785 Hyperlipidemia, unspecified: Secondary | ICD-10-CM | POA: Insufficient documentation

## 2015-09-01 DIAGNOSIS — I10 Essential (primary) hypertension: Secondary | ICD-10-CM | POA: Diagnosis not present

## 2015-09-01 DIAGNOSIS — N451 Epididymitis: Secondary | ICD-10-CM | POA: Diagnosis not present

## 2015-09-01 DIAGNOSIS — R1030 Lower abdominal pain, unspecified: Secondary | ICD-10-CM

## 2015-09-01 HISTORY — DX: Epididymitis: N45.1

## 2015-09-01 LAB — BASIC METABOLIC PANEL WITH GFR
Anion gap: 9 (ref 5–15)
BUN: 19 mg/dL (ref 6–20)
CO2: 21 mmol/L — ABNORMAL LOW (ref 22–32)
Calcium: 9.4 mg/dL (ref 8.9–10.3)
Chloride: 108 mmol/L (ref 101–111)
Creatinine, Ser: 1.34 mg/dL — ABNORMAL HIGH (ref 0.61–1.24)
GFR calc Af Amer: >60 mL/min
GFR calc non Af Amer: 54 mL/min — ABNORMAL LOW
Glucose, Bld: 112 mg/dL — ABNORMAL HIGH (ref 65–99)
Potassium: 4.8 mmol/L (ref 3.5–5.1)
Sodium: 138 mmol/L (ref 135–145)

## 2015-09-01 LAB — URINALYSIS, ROUTINE W REFLEX MICROSCOPIC
Bilirubin Urine: NEGATIVE
Glucose, UA: NEGATIVE mg/dL
Hgb urine dipstick: NEGATIVE
Ketones, ur: NEGATIVE mg/dL
Leukocytes, UA: NEGATIVE
Nitrite: NEGATIVE
Protein, ur: NEGATIVE mg/dL
Specific Gravity, Urine: >1.03 — ABNORMAL HIGH (ref 1.005–1.030)
pH: 5 (ref 5.0–8.0)

## 2015-09-01 LAB — CBC WITH DIFFERENTIAL/PLATELET
Basophils Absolute: 0 10*3/uL (ref 0.0–0.1)
Basophils Relative: 0 %
EOS PCT: 3 %
Eosinophils Absolute: 0.2 10*3/uL (ref 0.0–0.7)
HCT: 40 % (ref 39.0–52.0)
Hemoglobin: 12.7 g/dL — ABNORMAL LOW (ref 13.0–17.0)
LYMPHS ABS: 2.2 10*3/uL (ref 0.7–4.0)
Lymphocytes Relative: 34 %
MCH: 26.4 pg (ref 26.0–34.0)
MCHC: 31.8 g/dL (ref 30.0–36.0)
MCV: 83.2 fL (ref 78.0–100.0)
MONOS PCT: 9 %
Monocytes Absolute: 0.6 10*3/uL (ref 0.1–1.0)
Neutro Abs: 3.5 10*3/uL (ref 1.7–7.7)
Neutrophils Relative %: 54 %
PLATELETS: 475 10*3/uL — AB (ref 150–400)
RBC: 4.81 MIL/uL (ref 4.22–5.81)
RDW: 14.1 % (ref 11.5–15.5)
WBC: 6.5 10*3/uL (ref 4.0–10.5)

## 2015-09-01 MED ORDER — HYDROCODONE-ACETAMINOPHEN 5-325 MG PO TABS: 1.0000 | ORAL_TABLET | Freq: Four times a day (QID) | ORAL | Status: DC | PRN

## 2015-09-01 MED ORDER — SODIUM CHLORIDE 0.9 % IV BOLUS (SEPSIS)
500.0000 mL | Freq: Once | INTRAVENOUS | Status: AC
  Administered 2015-09-01: 500 mL via INTRAVENOUS

## 2015-09-01 MED ORDER — FENTANYL CITRATE (PF) 100 MCG/2ML IJ SOLN
50.0000 ug | Freq: Once | INTRAMUSCULAR | Status: AC
  Administered 2015-09-01: 50 ug via INTRAVENOUS
  Filled 2015-09-01: qty 2

## 2015-09-01 NOTE — Discharge Instructions (Signed)
As discussed, your evaluation today has been largely reassuring.  But, it is important that you monitor your condition carefully, and do not hesitate to return to the ED if you develop new, or concerning changes in your condition. ? ?Otherwise, please follow-up with your physician for appropriate ongoing care. ? ?

## 2015-09-01 NOTE — ED Provider Notes (Signed)
CSN: [id number]     Arrival date & time 09/01/15  1436 History   First MD Initiated Contact with Patient 09/01/15 1531     Chief Complaint  Patient presents with  . Groin Pain     (Consider location/radiation/quality/duration/timing/severity/associated sxs/prior Treatment) HPI Patient presents with his wife who assists with the history of present illness. He notes that since discharge from this facility about one week ago, he has been in generally similar condition, with intermittent left lower quadrant abdominal pain. Yesterday he had a severe exacerbation, controlled with oral narcotics. Today, the pain recurred, was even more severe, focal, sharp, nonradiating in the left lower quadrant. No scrotal pain, no dysuria, hematuria, mild nausea, mild weakness.  Past Medical History  Diagnosis Date  . Diabetes mellitus without complication (HCC)   . Hypertension   . Coronary artery disease   . Peripheral arterial disease (HCC)   . Hyperlipidemia   . Epididymitis, left    Past Surgical History  Procedure Laterality Date  . Above knee leg amputation      right leg  . Hemorrhoid surgery     No family history on file. Social History  Substance Use Topics  . Smoking status: Former Games developer  . Smokeless tobacco: None  . Alcohol Use: No    Review of Systems  Constitutional:       Per HPI, otherwise negative  HENT:       Per HPI, otherwise negative  Respiratory:       Per HPI, otherwise negative  Cardiovascular:       Per HPI, otherwise negative  Gastrointestinal: Positive for nausea. Negative for vomiting.  Endocrine:       Negative aside from HPI  Genitourinary:       Neg aside from HPI   Musculoskeletal:       Per HPI, otherwise negative  Skin: Negative.   Neurological: Negative for syncope.      Allergies  Ivp dye  Home Medications   Prior to Admission medications   Medication Sig Start Date End Date Taking? Authorizing Provider  aspirin 81 MG tablet Take 81  mg by mouth every morning.     Historical Provider, MD  atorvastatin (LIPITOR) 40 MG tablet Take 40 mg by mouth every evening.     Historical Provider, MD  cloNIDine (CATAPRES) 0.3 MG tablet Take 0.3 mg by mouth 2 (two) times daily.    Historical Provider, MD  ergocalciferol (VITAMIN D2) 50000 units capsule Take 50,000 Units by mouth once a week.    Historical Provider, MD  ferrous sulfate 325 (65 FE) MG tablet Take 325 mg by mouth 2 (two) times daily with a meal.    Historical Provider, MD  glimepiride (AMARYL) 2 MG tablet Take 2 mg by mouth daily with breakfast.    Historical Provider, MD  hydrALAZINE (APRESOLINE) 25 MG tablet Take 25 mg by mouth 3 (three) times daily.    Historical Provider, MD  labetalol (NORMODYNE) 200 MG tablet Take 400 mg by mouth 2 (two) times daily.    Historical Provider, MD  losartan (COZAAR) 50 MG tablet Take 50 mg by mouth every morning.     Historical Provider, MD  magnesium oxide (MAG-OX) 400 MG tablet Take 400 mg by mouth 2 (two) times daily.    Historical Provider, MD  metFORMIN (GLUCOPHAGE) 1000 MG tablet Take 1,000 mg by mouth 2 (two) times daily with a meal.    Historical Provider, MD  montelukast (SINGULAIR) 10 MG tablet Take  10 mg by mouth at bedtime.    Historical Provider, MD  rivaroxaban (XARELTO) 20 MG TABS tablet Take 20 mg by mouth daily with supper.    Historical Provider, MD  sulfamethoxazole-trimethoprim (BACTRIM DS) 800-160 MG tablet Take 1 tablet by mouth 2 (two) times daily. 08/26/15   Henderson CloudEstela Y Hernandez Acosta, MD  traMADol (ULTRAM) 50 MG tablet Take 1 tablet (50 mg total) by mouth every 6 (six) hours as needed. 08/23/15   Eber HongBrian Miller, MD   BP 148/51 mmHg  Pulse 68  Temp(Src) 98 F (36.7 C) (Oral)  Resp 16  Ht 5\' 6"  (1.676 m)  SpO2 99% Physical Exam  Constitutional: He is oriented to person, place, and time. He appears well-developed. No distress.  HENT:  Head: Normocephalic and atraumatic.  Eyes: Conjunctivae and EOM are normal.    Cardiovascular: Normal rate and regular rhythm.   Pulmonary/Chest: Effort normal. No stridor. No respiratory distress.  Abdominal: He exhibits no distension.    Genitourinary: Penis normal.    Uncircumcised.  Musculoskeletal: He exhibits no edema.       Arms: Neurological: He is alert and oriented to person, place, and time.  Skin: Skin is warm and dry.  Psychiatric: He has a normal mood and affect.  Nursing note and vitals reviewed.   ED Course  Procedures (including critical care time) Labs Review Labs Reviewed  URINALYSIS, ROUTINE W REFLEX MICROSCOPIC (NOT AT [facility]) - Abnormal; Notable for the following:    Specific Gravity, Urine >1.030 (*)    All other components within normal limits  BASIC METABOLIC PANEL - Abnormal; Notable for the following:    CO2 21 (*)    Glucose, Bld 112 (*)    Creatinine, Ser 1.34 (*)    GFR calc non Af Amer 54 (*)    All other components within normal limits  CBC WITH DIFFERENTIAL/PLATELET - Abnormal; Notable for the following:    Hemoglobin 12.7 (*)    Platelets 475 (*)    All other components within normal limits    Imaging Review Ct Abdomen Pelvis Wo Contrast  09/01/2015  CLINICAL DATA:  Left lower quadrant pain for 1 week EXAM: CT ABDOMEN AND PELVIS WITHOUT CONTRAST TECHNIQUE: Multidetector CT imaging of the abdomen and pelvis was performed following the standard protocol without IV contrast. COMPARISON:  None FINDINGS: Lower chest:  No pleural effusion.  The lung bases appear clear. Hepatobiliary: There is no suspicious liver abnormality identified. The gallbladder is unremarkable. No biliary dilatation. Pancreas: Unremarkable appearance of the pancreas. Spleen: The spleen is negative. Adrenals/Urinary Tract: Normal adrenal glands. Normal appearance of the kidneys. No kidney stones or hydronephrosis identified. No ureteral calculi noted. The urinary bladder appears normal. Stomach/Bowel: The stomach is normal. The small bowel loops have a  normal course and caliber. No bowel obstruction. The appendix is visualized and appears unremarkable. No pathologic dilatation of the colon. Vascular/Lymphatic: Calcified atherosclerotic disease involves the abdominal aorta. No aneurysm. Postsurgical changes from bilateral common femoral bypass identified. Prominent periaortic lymph nodes are identified measuring up to 9 mm. No adenopathy. There is no pelvic or inguinal adenopathy. Reproductive: The prostate gland and seminal vesicles appear normal. Other: There is no ascites or focal fluid collections within the abdomen or pelvis. Musculoskeletal: Mild spondylosis within the thoracic spine noted. IMPRESSION: 1. No acute findings identified within the abdomen or pelvis. 2. Aortic atherosclerosis 3. Postsurgical changes from bilateral common femoral artery bypass surgery identified. Electronically Signed   By: Veronda Prudeaylor  Stroud M.D.  On: 09/01/2015 18:32   I have personally reviewed and evaluated these images and lab results as part of my medical decision-making.  Chart review notable for recent admission for epididymitis, orchiitis  8:38 PM Patient appears better. His received 2 doses of narcotic analgesia. I reviewed all findings with him and his wife. With reassuring findings, adequate pain control, he will follow-up with urology in 2 days.  MDM  Patient presents with ongoing groin pain. Patient has recent episode of epididymitis. Here, CT scan, labs, vitals reassuring, with low suspicion for occult infection, no evidence for deep space infection. Symptoms maybe secondary to residual inflammation versus scar tissue. Patient discharged in stable condition to follow-up with urology in 2 days.   Gerhard Munch, MD 09/01/15 2039

## 2015-09-01 NOTE — ED Notes (Addendum)
Patient c/o left groin pain that radiates into testicles. Per patient 3rd visit to ER for pain. Patient diagnosed with epididymitis, given antibiotics and tramadol. Patient took last dose of antibiotics yesterday. Denies any relief in pain with medication. Patient has follow-up visit with urologist Monday.

## 2015-09-01 NOTE — ED Notes (Signed)
Patient verbalizes understanding of discharge instructions, prescription medications, home care and follow up care. Patient out of department at this time with family. 

## 2016-03-17 ENCOUNTER — Emergency Department (HOSPITAL_COMMUNITY)
Admission: EM | Admit: 2016-03-17 | Discharge: 2016-03-17 | Disposition: A | Discharge status: Home / Self Care | Payer: Medicare Other | Attending: Emergency Medicine | Admitting: Emergency Medicine

## 2016-03-17 ENCOUNTER — Emergency Department (HOSPITAL_COMMUNITY): Payer: Medicare Other

## 2016-03-17 ENCOUNTER — Encounter (HOSPITAL_COMMUNITY): Payer: Self-pay | Admitting: Emergency Medicine

## 2016-03-17 DIAGNOSIS — Z7984 Long term (current) use of oral hypoglycemic drugs: Secondary | ICD-10-CM | POA: Diagnosis not present

## 2016-03-17 DIAGNOSIS — R52 Pain, unspecified: Secondary | ICD-10-CM

## 2016-03-17 DIAGNOSIS — Z79899 Other long term (current) drug therapy: Secondary | ICD-10-CM | POA: Diagnosis not present

## 2016-03-17 DIAGNOSIS — I129 Hypertensive chronic kidney disease with stage 1 through stage 4 chronic kidney disease, or unspecified chronic kidney disease: Secondary | ICD-10-CM | POA: Insufficient documentation

## 2016-03-17 DIAGNOSIS — R109 Unspecified abdominal pain: Secondary | ICD-10-CM | POA: Diagnosis present

## 2016-03-17 DIAGNOSIS — Z87891 Personal history of nicotine dependence: Secondary | ICD-10-CM | POA: Diagnosis not present

## 2016-03-17 DIAGNOSIS — E1122 Type 2 diabetes mellitus with diabetic chronic kidney disease: Secondary | ICD-10-CM | POA: Insufficient documentation

## 2016-03-17 DIAGNOSIS — Z794 Long term (current) use of insulin: Secondary | ICD-10-CM | POA: Insufficient documentation

## 2016-03-17 DIAGNOSIS — N183 Chronic kidney disease, stage 3 (moderate): Secondary | ICD-10-CM | POA: Insufficient documentation

## 2016-03-17 DIAGNOSIS — R1031 Right lower quadrant pain: Secondary | ICD-10-CM | POA: Diagnosis not present

## 2016-03-17 DIAGNOSIS — I2510 Atherosclerotic heart disease of native coronary artery without angina pectoris: Secondary | ICD-10-CM | POA: Diagnosis not present

## 2016-03-17 DIAGNOSIS — Z7982 Long term (current) use of aspirin: Secondary | ICD-10-CM | POA: Insufficient documentation

## 2016-03-17 LAB — CBC
HEMATOCRIT: 46.5 % (ref 39.0–52.0)
HEMOGLOBIN: 15.3 g/dL (ref 13.0–17.0)
MCH: 27.2 pg (ref 26.0–34.0)
MCHC: 32.9 g/dL (ref 30.0–36.0)
MCV: 82.7 fL (ref 78.0–100.0)
Platelets: 270 10*3/uL (ref 150–400)
RBC: 5.62 MIL/uL (ref 4.22–5.81)
RDW: 14.5 % (ref 11.5–15.5)
WBC: 7.1 10*3/uL (ref 4.0–10.5)

## 2016-03-17 LAB — COMPREHENSIVE METABOLIC PANEL
ALBUMIN: 4.7 g/dL (ref 3.5–5.0)
ALK PHOS: 76 U/L (ref 38–126)
ALT: 26 U/L (ref 17–63)
ANION GAP: 12 (ref 5–15)
AST: 25 U/L (ref 15–41)
BUN: 22 mg/dL — ABNORMAL HIGH (ref 6–20)
CHLORIDE: 101 mmol/L (ref 101–111)
CO2: 23 mmol/L (ref 22–32)
Calcium: 10 mg/dL (ref 8.9–10.3)
Creatinine, Ser: 1.53 mg/dL — ABNORMAL HIGH (ref 0.61–1.24)
GFR calc non Af Amer: 46 mL/min — ABNORMAL LOW (ref >60)
GFR, EST AFRICAN AMERICAN: 53 mL/min — AB (ref >60)
GLUCOSE: 147 mg/dL — AB (ref 65–99)
POTASSIUM: 4.3 mmol/L (ref 3.5–5.1)
SODIUM: 136 mmol/L (ref 135–145)
Total Bilirubin: 0.4 mg/dL (ref 0.3–1.2)
Total Protein: 8 g/dL (ref 6.5–8.1)

## 2016-03-17 LAB — URINE MICROSCOPIC-ADD ON

## 2016-03-17 LAB — URINALYSIS, ROUTINE W REFLEX MICROSCOPIC
BILIRUBIN URINE: NEGATIVE
Glucose, UA: NEGATIVE mg/dL
Ketones, ur: NEGATIVE mg/dL
Leukocytes, UA: NEGATIVE
Nitrite: NEGATIVE
PH: 5.5 (ref 5.0–8.0)

## 2016-03-17 LAB — LIPASE, BLOOD: LIPASE: 46 U/L (ref 11–51)

## 2016-03-17 MED ORDER — HYDROCODONE-ACETAMINOPHEN 5-325 MG PO TABS: 1.0000 | ORAL_TABLET | Freq: Four times a day (QID) | ORAL | 0 refills | Status: AC | PRN

## 2016-03-17 NOTE — ED Provider Notes (Signed)
[facility]-EMERGENCY DEPT Provider Note   CSN: [id number] Arrival date & time: 03/17/16  1556     History   Chief Complaint Chief Complaint  Patient presents with  . Flank Pain    HPI Jeffrey Daugherty is a 65 y.o. male.  Patient complains of right groin pain which has now disappeared   The history is provided by the patient. No language interpreter was used.  Flank Pain  This is a new problem. The current episode started less than 1 hour ago. The problem occurs rarely. The problem has been resolved. Pertinent negatives include no chest pain, no abdominal pain and no headaches. Nothing aggravates the symptoms. Nothing relieves the symptoms. He has tried nothing for the symptoms. The treatment provided no relief.    Past Medical History:  Diagnosis Date  . Coronary artery disease   . Diabetes mellitus without complication (HCC)   . Epididymitis, left   . Hyperlipidemia   . Hypertension   . Peripheral arterial disease [facility](HCC)     Patient Active Problem List   Diagnosis Date Noted  . Fever 08/24/2015  . Sepsis (HCC) 08/24/2015  . Epididymoorchitis 08/24/2015  . Hypertension 08/24/2015  . Type II diabetes mellitus (HCC) 08/24/2015  . CKD stage 3 secondary to diabetes (HCC) 08/24/2015  . Epididymo-orchitis, acute   . Urinary tract infectious disease     Past Surgical History:  Procedure Laterality Date  . ABOVE KNEE LEG AMPUTATION     right leg  . HEMORRHOID SURGERY         Home Medications    Prior to Admission medications   Medication Sig Start Date End Date Taking? Authorizing Provider  aspirin 81 MG tablet Take 81 mg by mouth every morning.    Yes Historical Provider, MD  atorvastatin (LIPITOR) 40 MG tablet Take 40 mg by mouth every evening.    Yes Historical Provider, MD  cloNIDine (CATAPRES) 0.3 MG tablet Take 0.3 mg by mouth 2 (two) times daily.   Yes Historical Provider, MD  DULoxetine (CYMBALTA) 30 MG capsule Take 30 mg by mouth 2 (two) times daily.  03/13/16  Yes  Historical Provider, MD  ferrous sulfate 325 (65 FE) MG tablet Take 325 mg by mouth 2 (two) times daily with a meal.   Yes Historical Provider, MD  glimepiride (AMARYL) 4 MG tablet Take one tablet by mouth once daily 02/24/16  Yes Historical Provider, MD  hydrALAZINE (APRESOLINE) 25 MG tablet Take 25 mg by mouth 3 (three) times daily.   Yes Historical Provider, MD  labetalol (NORMODYNE) 200 MG tablet Take 400 mg by mouth 2 (two) times daily.   Yes Historical Provider, MD  LEVEMIR FLEXTOUCH 100 UNIT/ML Pen Inject 12 Units into the skin 2 (two) times daily.  03/14/16  Yes Historical Provider, MD  losartan (COZAAR) 50 MG tablet Take 50 mg by mouth at bedtime.    Yes Historical Provider, MD  magnesium oxide (MAG-OX) 400 MG tablet Take 400 mg by mouth daily.   Yes Historical Provider, MD  metFORMIN (GLUCOPHAGE) 1000 MG tablet Take 1,000 mg by mouth 2 (two) times daily with a meal.   Yes Historical Provider, MD  montelukast (SINGULAIR) 10 MG tablet Take 10 mg by mouth at bedtime.   Yes Historical Provider, MD  rivaroxaban (XARELTO) 20 MG TABS tablet Take 20 mg by mouth daily.    Yes Historical Provider, MD  HYDROcodone-acetaminophen (NORCO/VICODIN) 5-325 MG tablet Take 1 tablet by mouth every 6 (six) hours as needed for moderate pain.  03/17/16   Bethann Berkshire, MD  sulfamethoxazole-trimethoprim (BACTRIM DS) 800-160 MG tablet Take 1 tablet by mouth 2 (two) times daily. Patient not taking: Reported on 09/01/2015 08/26/15   Henderson Cloud, MD    Family History No family history on file.  Social History Social History  Substance Use Topics  . Smoking status: Former Games developer  . Smokeless tobacco: Never Used     Comment: Quit in 2014  . Alcohol use No     Allergies   Ivp dye [iodinated diagnostic agents]   Review of Systems Review of Systems  Constitutional: Negative for appetite change and fatigue.  HENT: Negative for congestion, ear discharge and sinus pressure.   Eyes: Negative for  discharge.  Respiratory: Negative for cough.   Cardiovascular: Negative for chest pain.  Gastrointestinal: Negative for abdominal pain and diarrhea.       Right groin pain  Genitourinary: Positive for flank pain. Negative for frequency and hematuria.  Musculoskeletal: Negative for back pain.  Skin: Negative for rash.  Neurological: Negative for seizures and headaches.  Psychiatric/Behavioral: Negative for hallucinations.     Physical Exam Updated Vital Signs BP 142/78 (BP Location: Right Arm)   Pulse 78   Temp 98 F (36.7 C) (Oral)   Resp 16   Ht 5\' 6"  (1.676 m)   Wt 230 lb (104.3 kg)   SpO2 100%   BMI 37.12 kg/m   Physical Exam  Constitutional: He is oriented to person, place, and time. He appears well-developed.  HENT:  Head: Normocephalic.  Eyes: Conjunctivae and EOM are normal. No scleral icterus.  Neck: Neck supple. No thyromegaly present.  Cardiovascular: Normal rate and regular rhythm.  Exam reveals no gallop and no friction rub.   No murmur heard. Pulmonary/Chest: No stridor. He has no wheezes. He has no rales. He exhibits no tenderness.  Abdominal: He exhibits no distension. There is no tenderness. There is no rebound.  Musculoskeletal: Normal range of motion. He exhibits no edema.  Right BKA  Lymphadenopathy:    He has no cervical adenopathy.  Neurological: He is oriented to person, place, and time. He exhibits normal muscle tone. Coordination normal.  Skin: No rash noted. No erythema.  Psychiatric: He has a normal mood and affect. His behavior is normal.     ED Treatments / Results  Labs (all labs ordered are listed, but only abnormal results are displayed) Labs Reviewed  COMPREHENSIVE METABOLIC PANEL - Abnormal; Notable for the following:       Result Value   Glucose, Bld 147 (*)    BUN 22 (*)    Creatinine, Ser 1.53 (*)    GFR calc non Af Amer 46 (*)    GFR calc Af Amer 53 (*)    All other components within normal limits  URINALYSIS, ROUTINE W  REFLEX MICROSCOPIC (NOT AT [facility]) - Abnormal; Notable for the following:    Specific Gravity, Urine >1.030 (*)    Hgb urine dipstick LARGE (*)    Protein, ur TRACE (*)    All other components within normal limits  URINE MICROSCOPIC-ADD ON - Abnormal; Notable for the following:    Squamous Epithelial / LPF 0-5 (*)    Bacteria, UA RARE (*)    All other components within normal limits  URINE CULTURE  LIPASE, BLOOD  CBC    EKG  EKG Interpretation None       Radiology Ct Renal Stone Study  Result Date: 03/17/2016 CLINICAL DATA:  Acute onset of right-sided  flank pain and hematuria. Initial encounter. EXAM: CT ABDOMEN AND PELVIS WITHOUT CONTRAST TECHNIQUE: Multidetector CT imaging of the abdomen and pelvis was performed following the standard protocol without IV contrast. COMPARISON:  CT of the abdomen and pelvis from 09/01/2015 FINDINGS: Lower chest: A small calcified granuloma is noted at the right lung base. Scattered coronary artery calcifications are seen. Hepatobiliary: There is diffuse fatty infiltration within the liver. The gallbladder is unremarkable in appearance. The common bile duct remains normal in caliber. Pancreas: The pancreas is unremarkable in appearance. Spleen: The spleen is within normal limits. Adrenals/Urinary Tract: The adrenal glands are unremarkable in appearance. A 4 mm nonobstructing stone is noted at the interpole region of the right kidney. There is no evidence of hydronephrosis. No obstructing ureteral stones are identified. No perinephric stranding is seen. Stomach/Bowel: The stomach is unremarkable in appearance. The small bowel is within normal limits. The appendix is normal in caliber, without evidence of appendicitis. The colon is unremarkable in appearance. Vascular/Lymphatic: Minimal calcification is noted along the distal abdominal aorta and its branches. Mildly enlarged periaortic nodes, measuring up to 1.0 cm in size, are similar in appearance to the prior  study. No pelvic sidewall lymphadenopathy is appreciated. Postoperative change is noted at the common femoral arteries and along the branches of the right common femoral artery. Reproductive: The bladder is mildly distended and grossly unremarkable. The prostate is borderline normal in size. Other: No additional soft tissue abnormalities are seen. Musculoskeletal: No acute osseous abnormalities are identified. The visualized musculature is unremarkable in appearance. IMPRESSION: 1. No acute abnormality seen to explain the patient's symptoms. 2. 4 mm nonobstructing stone at the interpole region of the right kidney. 3. Diffuse fatty infiltration within the liver. 4. Scattered coronary artery calcifications seen. 5. Mildly enlarged periaortic nodes, measuring up to 1.0 cm in size, are similar in appearance to the prior study. Electronically Signed   By: Roanna Raider M.D.   On: 03/17/2016 20:25    Procedures Procedures (including critical care time)  Medications Ordered in ED Medications - No data to display   Initial Impression / Assessment and Plan / ED Course  I have reviewed the triage vital signs and the nursing notes.  Pertinent labs & imaging results that were available during my care of the patient were reviewed by me and considered in my medical decision making (see chart for details).  Clinical Course    Patient with hematuria. Patient is a kidney stone in his kidney. It's possible that he has passed a recent stone. Patient will have his urine cultured and will follow-up with his PCP  Final Clinical Impressions(s) / ED Diagnoses   Final diagnoses:  Pain  Flank pain    New Prescriptions New Prescriptions   HYDROCODONE-ACETAMINOPHEN (NORCO/VICODIN) 5-325 MG TABLET    Take 1 tablet by mouth every 6 (six) hours as needed for moderate pain.     Bethann Berkshire, MD 03/17/16 2134

## 2016-03-17 NOTE — Discharge Instructions (Signed)
Follow-up with your family doctor next week for recheck. You can give her doctor the lab results to look at

## 2016-03-17 NOTE — ED Triage Notes (Signed)
Pt c/o sudden onset of RT sided flank pain without gi/gu symptoms. Denies pmh of kidney stones.

## 2016-03-19 LAB — URINE CULTURE: Culture: NO GROWTH

## 2016-09-29 IMAGING — CT CT RENAL STONE PROTOCOL
2 of 4 series · 15 of 46 positions shown, 17 images · non-contrast
Comparison: CT of the abdomen and pelvis from 09/01/2015

CLINICAL DATA: Acute onset of right-sided flank pain and hematuria.
Initial encounter.

EXAM:
CT ABDOMEN AND PELVIS WITHOUT CONTRAST
TECHNIQUE: Multidetector CT imaging of the abdomen and pelvis was performed
following the standard protocol without IV contrast.

[Series 2: axial st · axial · 0.85mm/px · z∈[+373,+833]mm · 12 of 102 slices shown, 14 images]
[im 5/102  soft-tissue]
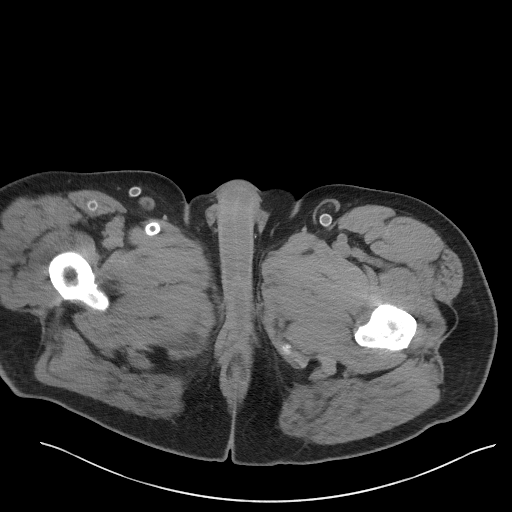
[im 5/102  bone]
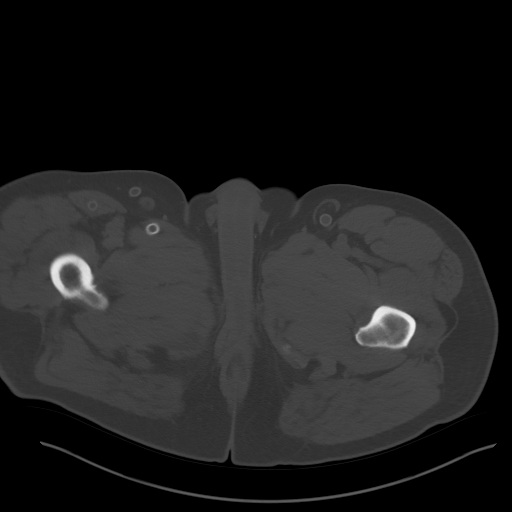
[im 14/102  soft-tissue]
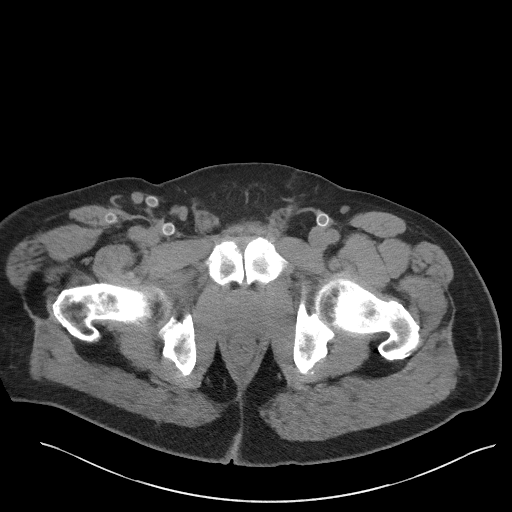
[im 23/102  soft-tissue]
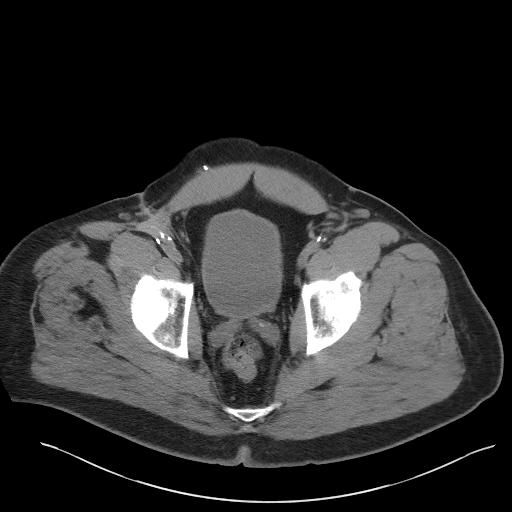
[im 33/102  soft-tissue]
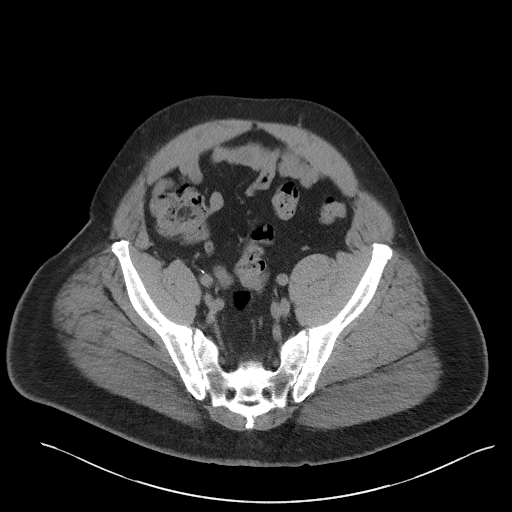
[im 37/102  soft-tissue]
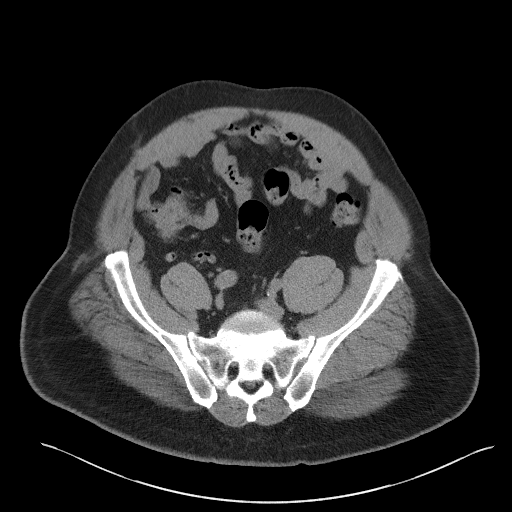
[im 46/102  soft-tissue]
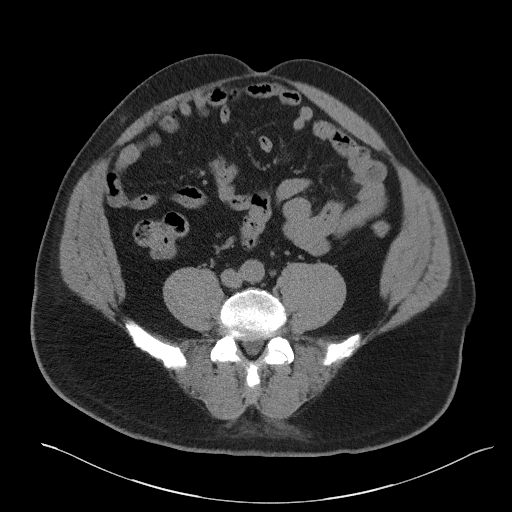
[im 56/102  soft-tissue]
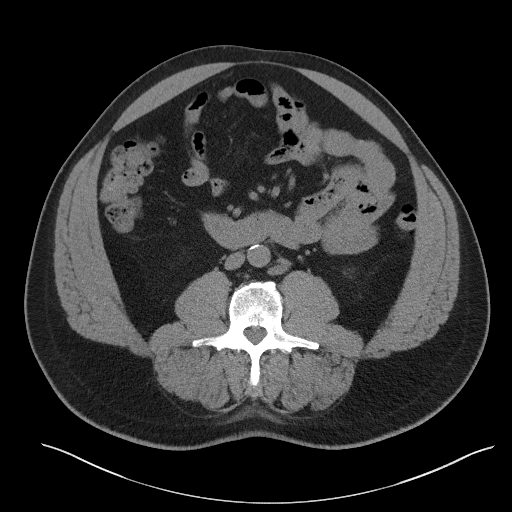
[im 65/102  soft-tissue]
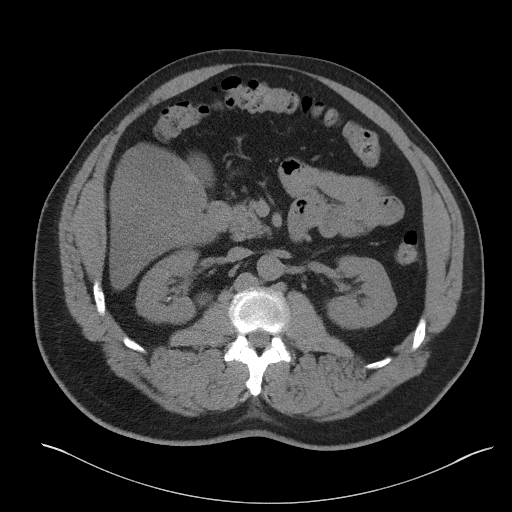
[im 69/102  soft-tissue]
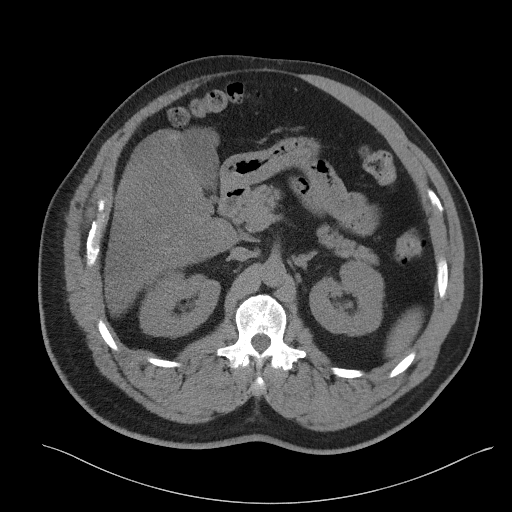
[im 69/102  bone]
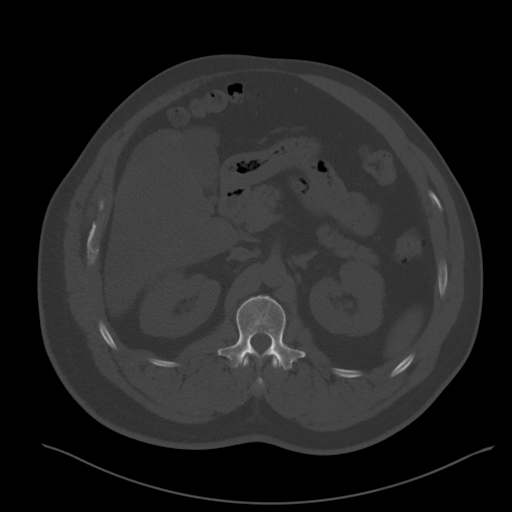
[im 79/102  soft-tissue]
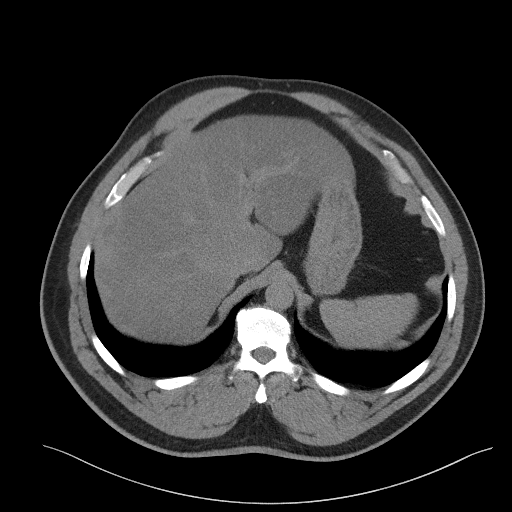
[im 88/102  soft-tissue]
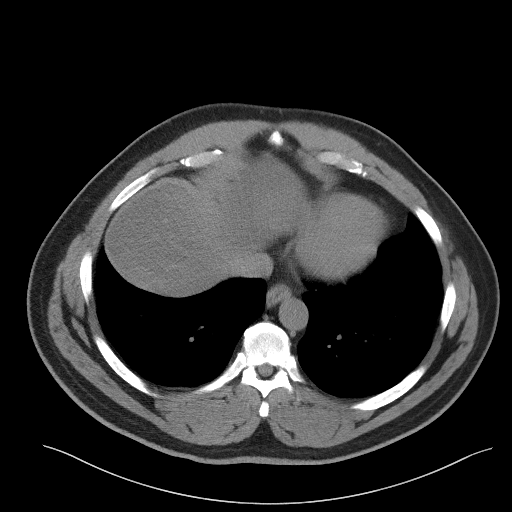
[im 97/102  soft-tissue]
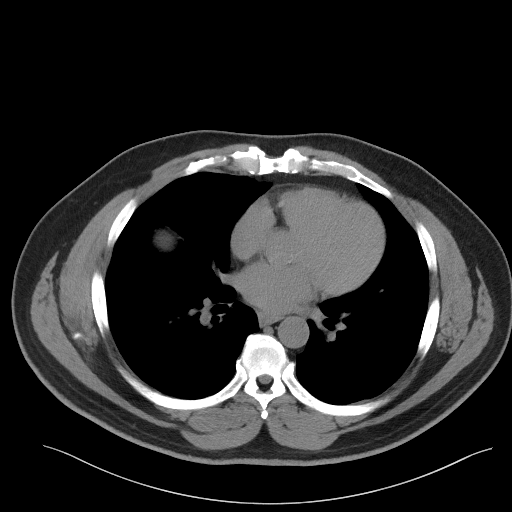

[Series 4: coronal st · coronal · 0.81mm/px · 3 of 116 slices shown]
[im 39/116  soft-tissue]
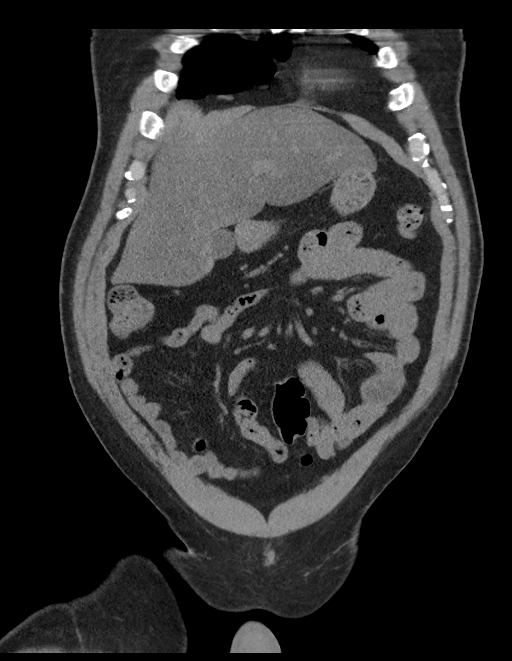
[im 52/116  soft-tissue]
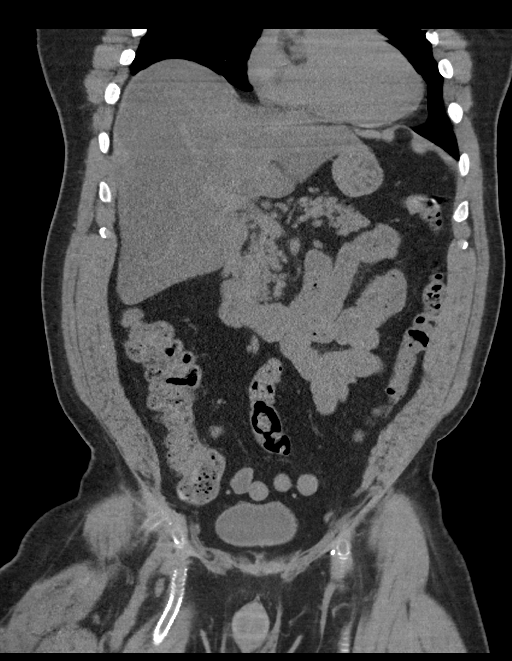
[im 64/116  soft-tissue]
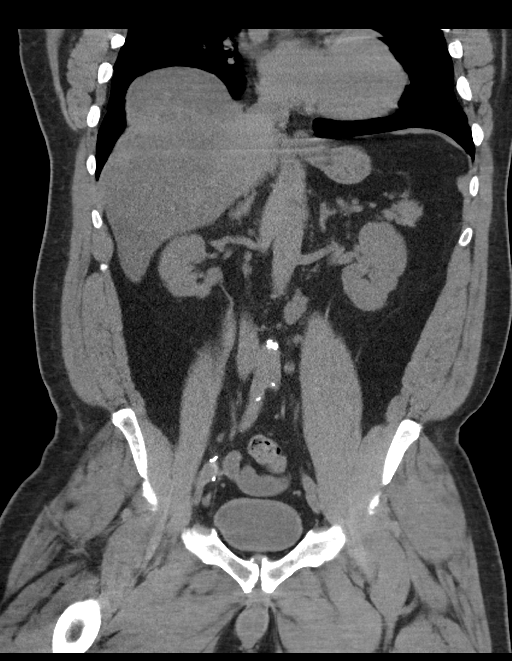

[15 of 46 positions shown; findings below may reference images not displayed]

FINDINGS: Lower chest: A small calcified granuloma is noted at the right lung
base. Scattered coronary artery calcifications are seen.

Hepatobiliary: There is diffuse fatty infiltration within the liver.
The gallbladder is unremarkable in appearance. The common bile duct
remains normal in caliber.

Pancreas: The pancreas is unremarkable in appearance.

Spleen: The spleen is within normal limits.

Adrenals/Urinary Tract: The adrenal glands are unremarkable in
appearance. A 4 mm nonobstructing stone is noted at the interpole
region of the right kidney. There is no evidence of hydronephrosis.
No obstructing ureteral stones are identified. No perinephric
stranding is seen.

Stomach/Bowel: The stomach is unremarkable in appearance. The small
bowel is within normal limits. The appendix is normal in caliber,
without evidence of appendicitis. The colon is unremarkable in
appearance.

Vascular/Lymphatic: Minimal calcification is noted along the distal
abdominal aorta and its branches. Mildly enlarged periaortic nodes,
measuring up to 1.0 cm in size, are similar in appearance to the
prior study. No pelvic sidewall lymphadenopathy is appreciated.

Postoperative change is noted at the common femoral arteries and
along the branches of the right common femoral artery.

Reproductive: The bladder is mildly distended and grossly
unremarkable. The prostate is borderline normal in size.

Other: No additional soft tissue abnormalities are seen.

Musculoskeletal: No acute osseous abnormalities are identified. The
visualized musculature is unremarkable in appearance.
IMPRESSION: 1. No acute abnormality seen to explain the patient's symptoms.
2. 4 mm nonobstructing stone at the interpole region of the right
kidney.
3. Diffuse fatty infiltration within the liver.
4. Scattered coronary artery calcifications seen.
5. Mildly enlarged periaortic nodes, measuring up to 1.0 cm in size,
are similar in appearance to the prior study.
